# Patient Record
Sex: Male | Born: 1990 | Race: White | Hispanic: No | Marital: Single | Smoking: Current every day smoker
Health system: Southern US, Community
[De-identification: ages and names within clinical notes are randomized; demographics above are authoritative.]

## PROBLEM LIST (undated history)

## (undated) DIAGNOSIS — B999 Unspecified infectious disease: Secondary | ICD-10-CM

## (undated) DIAGNOSIS — R569 Unspecified convulsions: Secondary | ICD-10-CM

## (undated) DIAGNOSIS — J020 Streptococcal pharyngitis: Secondary | ICD-10-CM

## (undated) DIAGNOSIS — A419 Sepsis, unspecified organism: Secondary | ICD-10-CM

---

## 2019-11-26 ENCOUNTER — Emergency Department (HOSPITAL_COMMUNITY): Payer: Self-pay

## 2019-11-26 ENCOUNTER — Encounter (HOSPITAL_COMMUNITY): Payer: Self-pay

## 2019-11-26 ENCOUNTER — Other Ambulatory Visit: Payer: Self-pay

## 2019-11-26 ENCOUNTER — Inpatient Hospital Stay (HOSPITAL_COMMUNITY)
Admission: EM | Admit: 2019-11-26 | Discharge: 2019-11-28 | DRG: 918 | Disposition: A | Discharge status: Home / Self Care | Payer: Self-pay | Attending: Internal Medicine | Admitting: Internal Medicine

## 2019-11-26 DIAGNOSIS — Z79899 Other long term (current) drug therapy: Secondary | ICD-10-CM

## 2019-11-26 DIAGNOSIS — Z20822 Contact with and (suspected) exposure to covid-19: Secondary | ICD-10-CM | POA: Diagnosis present

## 2019-11-26 DIAGNOSIS — F17210 Nicotine dependence, cigarettes, uncomplicated: Secondary | ICD-10-CM | POA: Diagnosis present

## 2019-11-26 DIAGNOSIS — K718 Toxic liver disease with other disorders of liver: Secondary | ICD-10-CM | POA: Diagnosis present

## 2019-11-26 DIAGNOSIS — Z880 Allergy status to penicillin: Secondary | ICD-10-CM

## 2019-11-26 DIAGNOSIS — R112 Nausea with vomiting, unspecified: Secondary | ICD-10-CM | POA: Diagnosis present

## 2019-11-26 DIAGNOSIS — R9431 Abnormal electrocardiogram [ECG] [EKG]: Secondary | ICD-10-CM | POA: Diagnosis present

## 2019-11-26 DIAGNOSIS — K449 Diaphragmatic hernia without obstruction or gangrene: Secondary | ICD-10-CM | POA: Diagnosis present

## 2019-11-26 DIAGNOSIS — K920 Hematemesis: Secondary | ICD-10-CM | POA: Diagnosis present

## 2019-11-26 DIAGNOSIS — T387X1A Poisoning by androgens and anabolic congeners, accidental (unintentional), initial encounter: Principal | ICD-10-CM | POA: Diagnosis present

## 2019-11-26 DIAGNOSIS — E872 Acidosis: Secondary | ICD-10-CM | POA: Diagnosis present

## 2019-11-26 DIAGNOSIS — F1911 Other psychoactive substance abuse, in remission: Secondary | ICD-10-CM | POA: Diagnosis present

## 2019-11-26 DIAGNOSIS — R109 Unspecified abdominal pain: Secondary | ICD-10-CM | POA: Diagnosis present

## 2019-11-26 DIAGNOSIS — K209 Esophagitis, unspecified without bleeding: Secondary | ICD-10-CM | POA: Diagnosis present

## 2019-11-26 DIAGNOSIS — R1032 Left lower quadrant pain: Secondary | ICD-10-CM

## 2019-11-26 DIAGNOSIS — R7401 Elevation of levels of liver transaminase levels: Secondary | ICD-10-CM | POA: Diagnosis present

## 2019-11-26 HISTORY — DX: Streptococcal pharyngitis: J02.0

## 2019-11-26 HISTORY — DX: Unspecified infectious disease: B99.9

## 2019-11-26 HISTORY — DX: Unspecified convulsions: R56.9

## 2019-11-26 HISTORY — DX: Sepsis, unspecified organism: A41.9

## 2019-11-26 LAB — PROTIME-INR
INR: 1 (ref 0.8–1.2)
Prothrombin Time: 12.7 seconds (ref 11.4–15.2)

## 2019-11-26 LAB — CK: Total CK: 143 U/L (ref 49–397)

## 2019-11-26 LAB — COMPREHENSIVE METABOLIC PANEL
ALT: 1677 U/L — ABNORMAL HIGH (ref 0–44)
AST: 320 U/L — ABNORMAL HIGH (ref 15–41)
Albumin: 4.2 g/dL (ref 3.5–5.0)
Alkaline Phosphatase: 74 U/L (ref 38–126)
Anion gap: 10 (ref 5–15)
BUN: 11 mg/dL (ref 6–20)
CO2: 24 mmol/L (ref 22–32)
Calcium: 8.9 mg/dL (ref 8.9–10.3)
Chloride: 105 mmol/L (ref 98–111)
Creatinine, Ser: 0.85 mg/dL (ref 0.61–1.24)
GFR calc Af Amer: >60 mL/min (ref >60)
GFR calc non Af Amer: >60 mL/min (ref >60)
Glucose, Bld: 87 mg/dL (ref 70–99)
Potassium: 4.5 mmol/L (ref 3.5–5.1)
Sodium: 139 mmol/L (ref 135–145)
Total Bilirubin: 1.1 mg/dL (ref 0.3–1.2)
Total Protein: 7.4 g/dL (ref 6.5–8.1)

## 2019-11-26 LAB — HEPATITIS PANEL, ACUTE
HCV Ab: NONREACTIVE
Hep A IgM: NONREACTIVE
Hep B C IgM: NONREACTIVE
Hepatitis B Surface Ag: NONREACTIVE

## 2019-11-26 LAB — CBC
HCT: 48.8 % (ref 39.0–52.0)
Hemoglobin: 15.5 g/dL (ref 13.0–17.0)
MCH: 30 pg (ref 26.0–34.0)
MCHC: 31.8 g/dL (ref 30.0–36.0)
MCV: 94.4 fL (ref 80.0–100.0)
Platelets: 346 10*3/uL (ref 150–400)
RBC: 5.17 MIL/uL (ref 4.22–5.81)
RDW: 14.2 % (ref 11.5–15.5)
WBC: 10.1 10*3/uL (ref 4.0–10.5)
nRBC: 0 % (ref 0.0–0.2)

## 2019-11-26 LAB — HIV ANTIBODY (ROUTINE TESTING W REFLEX): HIV Screen 4th Generation wRfx: NONREACTIVE

## 2019-11-26 LAB — ETHANOL: Alcohol, Ethyl (B): <10 mg/dL (ref <10)

## 2019-11-26 LAB — MONONUCLEOSIS SCREEN: Mono Screen: NEGATIVE

## 2019-11-26 LAB — TROPONIN I (HIGH SENSITIVITY): Troponin I (High Sensitivity): <2 ng/L (ref <18)

## 2019-11-26 LAB — SARS CORONAVIRUS 2 BY RT PCR (HOSPITAL ORDER, PERFORMED IN ~~LOC~~ HOSPITAL LAB): SARS Coronavirus 2: NEGATIVE

## 2019-11-26 LAB — LIPASE, BLOOD: Lipase: 30 U/L (ref 11–51)

## 2019-11-26 MED ORDER — PANTOPRAZOLE SODIUM 40 MG PO TBEC
40.0000 mg | DELAYED_RELEASE_TABLET | Freq: Two times a day (BID) | ORAL | Status: DC
  Administered 2019-11-26 – 2019-11-28 (×5): 40 mg via ORAL
  Filled 2019-11-26 (×5): qty 1

## 2019-11-26 MED ORDER — IBUPROFEN 400 MG PO TABS: 400.0000 mg | ORAL_TABLET | Freq: Four times a day (QID) | ORAL | Status: DC | PRN

## 2019-11-26 MED ORDER — SODIUM CHLORIDE (PF) 0.9 % IJ SOLN
INTRAMUSCULAR | Status: AC
  Filled 2019-11-26: qty 50

## 2019-11-26 MED ORDER — ONDANSETRON HCL 4 MG PO TABS: 4.0000 mg | ORAL_TABLET | Freq: Four times a day (QID) | ORAL | Status: DC | PRN

## 2019-11-26 MED ORDER — SODIUM CHLORIDE 0.9 % IV SOLN: INTRAVENOUS | Status: DC

## 2019-11-26 MED ORDER — PROMETHAZINE HCL 25 MG/ML IJ SOLN
25.0000 mg | Freq: Once | INTRAMUSCULAR | Status: AC
  Administered 2019-11-26: 25 mg via INTRAVENOUS
  Filled 2019-11-26: qty 1

## 2019-11-26 MED ORDER — SODIUM CHLORIDE 0.9 % IV BOLUS
1000.0000 mL | Freq: Once | INTRAVENOUS | Status: AC
  Administered 2019-11-26: 1000 mL via INTRAVENOUS

## 2019-11-26 MED ORDER — PANTOPRAZOLE SODIUM 40 MG IV SOLR
40.0000 mg | Freq: Once | INTRAVENOUS | Status: AC
  Administered 2019-11-26: 40 mg via INTRAVENOUS
  Filled 2019-11-26: qty 40

## 2019-11-26 MED ORDER — KETOROLAC TROMETHAMINE 30 MG/ML IJ SOLN
30.0000 mg | Freq: Four times a day (QID) | INTRAMUSCULAR | Status: DC | PRN
  Administered 2019-11-27: 30 mg via INTRAVENOUS
  Filled 2019-11-26: qty 1

## 2019-11-26 MED ORDER — SODIUM CHLORIDE 0.9% FLUSH
3.0000 mL | Freq: Once | INTRAVENOUS | Status: AC
  Administered 2019-11-26: 3 mL via INTRAVENOUS

## 2019-11-26 MED ORDER — ONDANSETRON HCL 4 MG/2ML IJ SOLN
4.0000 mg | Freq: Once | INTRAMUSCULAR | Status: AC
  Administered 2019-11-26: 4 mg via INTRAVENOUS
  Filled 2019-11-26: qty 2

## 2019-11-26 MED ORDER — ONDANSETRON HCL 4 MG/2ML IJ SOLN: 4.0000 mg | Freq: Four times a day (QID) | INTRAMUSCULAR | Status: DC | PRN

## 2019-11-26 MED ORDER — IOHEXOL 300 MG/ML  SOLN
100.0000 mL | Freq: Once | INTRAMUSCULAR | Status: AC | PRN
  Administered 2019-11-26: 100 mL via INTRAVENOUS

## 2019-11-26 MED ORDER — KETOROLAC TROMETHAMINE 15 MG/ML IJ SOLN
15.0000 mg | Freq: Once | INTRAMUSCULAR | Status: AC
  Administered 2019-11-26: 15 mg via INTRAVENOUS
  Filled 2019-11-26: qty 1

## 2019-11-26 NOTE — ED Provider Notes (Signed)
[facility]-EMERGENCY DEPT Provider Note   CSN: [id number] Arrival date & time: 11/26/19  0750     History Chief Complaint  Patient presents with  . Sore Throat  . Chest Pain  . Hematemesis  . Leg Pain    Dean Morales is a 29 y.o. male.  Presents to ER with concern for abdominal pain, vomiting.  Patient reports since yesterday he has had significant generalized malaise, fatigue as well as abdominal pain.  Abdominal pain worse across lower abdomen but feels pain across entire abdomen currently.  Has had a couple episodes of emesis, mostly nonbloody nonbilious however had some small streaks of blood earlier this morning.  Denies any chest pain.  Also feels like he is having a sore throat.  No fevers.  Reports history of polysubstance abuse, states that he has been clean for the last 10 months.  HPI     Past Medical History:  Diagnosis Date  . Blood infection (HCC)   . Seizures (HCC)   . Strep throat     There are no problems to display for this patient.   History reviewed. No pertinent surgical history.     History reviewed. No pertinent family history.  Social History   Tobacco Use  . Smoking status: Current Every Day Smoker    Packs/day: 0.75    Types: Cigarettes  . Smokeless tobacco: Never Used  Vaping Use  . Vaping Use: Some days  . Substances: Nicotine  Substance Use Topics  . Alcohol use: Not Currently  . Drug use: Not Currently    Home Medications Prior to Admission medications   Medication Sig Start Date End Date Taking? Authorizing Provider  hydrOXYzine (VISTARIL) 50 MG capsule Take 50 mg by mouth 3 (three) times daily as needed for anxiety.   Yes [provider]    Allergies    Penicillins  Review of Systems   Review of Systems  Constitutional: Negative for chills and fever.  HENT: Negative for ear pain and sore throat.   Eyes: Negative for pain and visual disturbance.  Respiratory: Negative for cough and  shortness of breath.   Cardiovascular: Negative for chest pain and palpitations.  Gastrointestinal: Positive for abdominal pain, nausea and vomiting.  Genitourinary: Negative for dysuria and hematuria.  Musculoskeletal: Negative for arthralgias and back pain.  Skin: Negative for color change and rash.  Neurological: Negative for seizures and syncope.  All other systems reviewed and are negative.   Physical Exam Updated Vital Signs BP 115/62   Pulse 61   Temp 97.7 F (36.5 C) (Oral)   Resp 15   Ht 5\' 9"  (1.753 m)   Wt 81.6 kg   SpO2 96%   BMI 26.58 kg/m   Physical Exam Vitals and nursing note reviewed.  Constitutional:      Appearance: He is well-developed.  HENT:     Head: Normocephalic and atraumatic.  Eyes:     Conjunctiva/sclera: Conjunctivae normal.  Cardiovascular:     Rate and Rhythm: Normal rate and regular rhythm.     Heart sounds: No murmur heard.   Pulmonary:     Effort: Pulmonary effort is normal. No respiratory distress.     Breath sounds: Normal breath sounds.  Abdominal:     Palpations: Abdomen is soft.     Tenderness: There is abdominal tenderness.     Comments: Tenderness noted in right lower quadrant, left lower quadrant, no rebound or guarding  Musculoskeletal:     Cervical back:  Neck supple.  Skin:    General: Skin is warm and dry.     Capillary Refill: Capillary refill takes less than 2 seconds.  Neurological:     General: No focal deficit present.     Mental Status: He is alert and oriented to person, place, and time.     ED Results / Procedures / Treatments   Labs (all labs ordered are listed, but only abnormal results are displayed) Labs Reviewed  COMPREHENSIVE METABOLIC PANEL - Abnormal; Notable for the following components:      Result Value   AST 320 (*)    ALT 1,677 (*)    All other components within normal limits  SARS CORONAVIRUS 2 BY RT PCR (HOSPITAL ORDER, PERFORMED IN [facility])  CBC  LIPASE, BLOOD    ETHANOL  PROTIME-INR  CK  HEPATITIS PANEL, ACUTE  CERULOPLASMIN  MONONUCLEOSIS SCREEN  HEPATITIS C VRS RNA DETECT BY PCR-QUAL  ANTI-SMOOTH MUSCLE ANTIBODY, IGG  ANA W/REFLEX IF POSITIVE  IGG  ACETAMINOPHEN LEVEL  TROPONIN I (HIGH SENSITIVITY)    EKG None  Radiology DG Chest 2 View  Result Date: 11/26/2019 CLINICAL DATA:  New onset chest pain EXAM: CHEST - 2 VIEW COMPARISON:  None. FINDINGS: The heart size and mediastinal contours are within normal limits. Both lungs are clear. The visualized skeletal structures are unremarkable. Mild apical pleural thickening most likely scarring. No pneumothorax. IMPRESSION: No active cardiopulmonary disease. Electronically Signed   By: Marlan Palau M.D.   On: 11/26/2019 08:53   CT ABDOMEN PELVIS W CONTRAST  Result Date: 11/26/2019 CLINICAL DATA:  Abdominal pain EXAM: CT ABDOMEN AND PELVIS WITH CONTRAST TECHNIQUE: Multidetector CT imaging of the abdomen and pelvis was performed using the standard protocol following bolus administration of intravenous contrast. CONTRAST:  OMNIPAQUE IOHEXOL 300 MG/ML  SOLN COMPARISON:  None. FINDINGS: Lower chest: No acute abnormality. Hepatobiliary: No focal liver abnormality is seen. No gallstones, gallbladder wall thickening, or biliary dilatation. Pancreas: Unremarkable. No pancreatic ductal dilatation or surrounding inflammatory changes. Spleen: Normal in size without focal abnormality. Adrenals/Urinary Tract: Adrenal glands are within normal limits. Kidneys demonstrate a normal enhancement pattern bilaterally. No obstructive changes are seen. Bladder is within normal limits. Stomach/Bowel: Stomach is within normal limits. Appendix appears normal. No evidence of bowel wall thickening, distention, or inflammatory changes. Vascular/Lymphatic: No significant vascular findings are present. No enlarged abdominal or pelvic lymph nodes. Reproductive: Prostate is unremarkable. Other: No abdominal wall hernia or  abnormality. No abdominopelvic ascites. Musculoskeletal: No acute or significant osseous findings. IMPRESSION: No acute abnormality noted. Electronically Signed   By: Alcide Clever M.D.   On: 11/26/2019 12:59    Procedures Procedures (including critical care time)  Medications Ordered in ED Medications  sodium chloride (PF) 0.9 % injection (has no administration in time range)  pantoprazole (PROTONIX) EC tablet 40 mg (has no administration in time range)  sodium chloride flush (NS) 0.9 % injection 3 mL (3 mLs Intravenous Given 11/26/19 1021)  ondansetron (ZOFRAN) injection 4 mg (4 mg Intravenous Given 11/26/19 1102)  sodium chloride 0.9 % bolus 1,000 mL (0 mLs Intravenous Stopped 11/26/19 1201)  pantoprazole (PROTONIX) injection 40 mg (40 mg Intravenous Given 11/26/19 1054)  iohexol (OMNIPAQUE) 300 MG/ML solution 100 mL (100 mLs Intravenous Contrast Given 11/26/19 1226)  ketorolac (TORADOL) 15 MG/ML injection 15 mg (15 mg Intravenous Given 11/26/19 1341)  promethazine (PHENERGAN) injection 25 mg (25 mg Intravenous Given 11/26/19 1341)    ED Course  I have reviewed the  triage vital signs and the nursing notes.  Pertinent labs & imaging results that were available during my care of the patient were reviewed by me and considered in my medical decision making (see chart for details).  Clinical Course as of Nov 25 1456  Mon Nov 26, 2019  1127 ALT(!): 1,677 [RD]  1127 AST(!): 320 [RD]  1410 Updated pt, agreeable to admit   [RD]  1419 D/w hospitalist Arbutus Ped who will admit   [RD]    Clinical Course User Index [RD] Lucrezia Starch, MD   MDM Rules/Calculators/A&P                          29 year old male presents to ER with concern for abdominal pain, vomiting.  On exam patient is uncomfortable but in no distress.  Vital signs stable.  Noted some tenderness in lower abdomen.  Lab work concerning for transaminitis, significantly elevated ALT.  CT scan negative.  Consulted gastroenterology.   They recommended adding on a couple extra labs to further investigate as well as admitting to hospitalist service for further observation and monitoring of his LFTs.  They will come evaluate patient and place consultation note.  Discussed with Dr. Arbutus Ped with [facility] who will admit.  Patient agreeable.  Of note, patient reports prior history of opiate abuse and has declined any opiates thus far in hospitalization stay.  Pain improved after receiving Phenergan and Toradol.  Final Clinical Impression(s) / ED Diagnoses Final diagnoses:  Transaminitis  Intractable nausea and vomiting    Rx / DC Orders ED Discharge Orders    None       Lucrezia Starch, MD 11/26/19 1458

## 2019-11-26 NOTE — ED Notes (Signed)
Patient transported to CT 

## 2019-11-26 NOTE — Consult Note (Signed)
 Referring Provider:  [facility]         Primary Care Physician:  Patient, No Pcp Per Primary Gastroenterologist: unassigned             We were asked to see this patient for:   Elevated LFTs               ASSESSMENT /  PLAN    Dean Morales is a 29 y.o. male PMH significant for, but not necessarily limited to, seizures, polysubstance abuse.   # Nausea / vomiting / ? Hematemesis.  --Sounds more like he expectorated blood after several episodes of non-bloody emesis. Could be esophagitis or a Mallory Weiss tear. Overall volume of bleeding sounds low and hgb is normal ( as is BUN).  --For further evaluation will proceed with EGD in am. The risks and benefits of EGD were discussed and the patient agrees to proceed.  --BID PPI in interim  # Elevated LAE. Workup in progress.  --AST 320, ALT 1677.  --Currently no evidence for acute liver failure. Mentation is normal. INR is normal. Glucose normal.  --Rule out viral etiologies.. With sore throat I ordered mono screen. Will hold off on EBV testing for now. Acute hepatitis panel pending.   --Will obtain labs to evaluate for other etiologies of liver disease ( autoimmune, genetic, metabolic, etc) --He recently started SARMs ( Selective Androgen Receptor Modulator) a body building supplement. Quick literature check suggests it has been associated with liver injury  --Trend LFts, am labs ordered.  --Avoid hepatotoxic medications.    HPI:    Chief Complaint: nausea and vomiting  Dean Morales is a 29 y.o. male in the ED with complaints of chest pain, nausea, vomiting with blood.  In the ED his WBC is normal. Hgb is normal at 15.5, BUN normal at 11. Lipase is normal. LFTs remarkable for ALT of 1677,  AST 320, remainder of LFTs normal. Etoh level < 10. SARS negative. No abdominal pain but was tender in lower abdomen prompting CT scan with contrast which was unremarkable. Acute hepatitis panel is pending.     Dean Morales felt fine on Saturday but  then yesterday developed a sore throat and nausea followed later by chest pain, back pain, LLQ pain and lower extremity pain. No fevers. This am he began having vomiting of bilious emesis x 4 . This was followed by "spitting' up of blood.No black stool or rectal bleeding.  No upper abdominal pain. No chronic GI problems. He gives a history of being hospitalized in [place] ~ 4 months ago with a "blood infection" but he has no other information. Symptoms at the time included sore throat. Doesn't recall there being any concurrent liver problems at the time. Trelyn doesn't take NSAIDs nor Tylenol. He hasn't had any Etoh nor illicit drugs in 10 months. 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 He hasn't been taking a supplement called SARMS ( selective androgen receptor modulator). He started it a week ago, took 1/2 dose for 7 days but none in last 3 days. Father had HCV / liver failure.    PREVIOUS ENDOSCOPIC EVALUATIONS / GI STUDIES : None  Past Medical History:  Diagnosis Date  . Blood infection (HCC)   . Seizures (HCC)   . Strep throat     History reviewed. No pertinent surgical history.  Prior to Admission medications   Medication Sig Start Date End Date Taking? Authorizing   Provider  hydrOXYzine (VISTARIL) 50 MG capsule Take 50 mg by mouth 3 (three) times daily as needed for anxiety.   Yes [provider]    Current Facility-Administered Medications  Medication Dose Route Frequency Provider Last Rate Last Admin  . sodium chloride (PF) 0.9 % injection            Current Outpatient Medications  Medication Sig Dispense Refill  . hydrOXYzine (VISTARIL) 50 MG capsule Take 50 mg by mouth 3 (three) times daily as needed for anxiety.      Allergies as of 11/26/2019 - Review Complete 11/26/2019  Allergen Reaction Noted  . Penicillins Anaphylaxis and Hives 11/26/2019    History reviewed. No pertinent family history.  Social History    Socioeconomic History  . Marital status: Single    Spouse name: Not on file  . Number of children: Not on file  . Years of education: Not on file  . Highest education level: Not on file  Occupational History  . Not on file  Tobacco Use  . Smoking status: Current Every Day Smoker    Packs/day: 0.75    Types: Cigarettes  . Smokeless tobacco: Never Used  Vaping Use  . Vaping Use: Some days  . Substances: Nicotine  Substance and Sexual Activity  . Alcohol use: Not Currently  . Drug use: Not Currently  . Sexual activity: Not on file  Other Topics Concern  . Not on file  Social History Narrative  . Not on file   Social Determinants of Health   Financial Resource Strain:   . Difficulty of Paying Living Expenses:   Food Insecurity:   . Worried About Charity fundraiser in the Last Year:   . Arboriculturist in the Last Year:   Transportation Needs:   . Film/video editor (Medical):   Marland Kitchen Lack of Transportation (Non-Medical):   Physical Activity:   . Days of Exercise per Week:   . Minutes of Exercise per Session:   Stress:   . Feeling of Stress :   Social Connections:   . Frequency of Communication with Friends and Family:   . Frequency of Social Gatherings with Friends and Family:   . Attends Religious Services:   . Active Member of Clubs or Organizations:   . Attends Archivist Meetings:   Marland Kitchen Marital Status:   Intimate Partner Violence:   . Fear of Current or Ex-Partner:   . Emotionally Abused:   Marland Kitchen Physically Abused:   . Sexually Abused:     Review of Systems: All systems reviewed and negative except where noted in HPI.  Physical Exam: Vital signs in last 24 hours: Temp:  [97.7 F (36.5 C)] 97.7 F (36.5 C) (06/21 0802) Pulse Rate:  [61-91] 61 (06/21 1400) Resp:  [9-31] 15 (06/21 1400) BP: (108-139)/(59-81) 115/62 (06/21 1400) SpO2:  [94 %-99 %] 96 % (06/21 1400) Weight:  [81.6 kg] 81.6 kg (06/21 0818)   General:   Alert, well-developed, male  in NAD Psych:  Pleasant, cooperative. Normal mood and affect. Eyes:  Pupils equal, sclera clear, no icterus.   Conjunctiva pink. Ears:  Normal auditory acuity. Nose:  No deformity, discharge,  or lesions. Neck:  Supple; no masses Lungs:  Clear throughout to auscultation.   No wheezes, crackles, or rhonchi.  Heart:  Regular rate and rhythm; no murmurs, no lower extremity edema Abdomen:  Soft, non-distended, mild LLQ tenderness.. BS active, no palp mass   Rectal:  Deferred  Msk:  Symmetrical without gross deformities. . Neurologic:  Alert and  oriented x4;  grossly normal neurologically. Skin:  Intact without significant lesions or rashes. Multiple tattoos  Intake/Output from previous day: No intake/output data recorded. Intake/Output this shift: No intake/output data recorded.  Lab Results: Recent Labs    11/26/19 1025  WBC 10.1  HGB 15.5  HCT 48.8  PLT 346   BMET Recent Labs    11/26/19 1025  NA 139  K 4.5  CL 105  CO2 24  GLUCOSE 87  BUN 11  CREATININE 0.85  CALCIUM 8.9   LFT Recent Labs    11/26/19 1025  PROT 7.4  ALBUMIN 4.2  AST 320*  ALT 1,677*  ALKPHOS 74  BILITOT 1.1   PT/INR No results for input(s): LABPROT, INR in the last 72 hours. Hepatitis Panel No results for input(s): HEPBSAG, HCVAB, HEPAIGM, HEPBIGM in the last 72 hours.   . CBC Latest Ref Rng & Units 11/26/2019  WBC 4.0 - 10.5 K/uL 10.1  Hemoglobin 13.0 - 17.0 g/dL 16.3  Hematocrit 39 - 52 % 48.8  Platelets 150 - 400 K/uL 346    . CMP Latest Ref Rng & Units 11/26/2019  Glucose 70 - 99 mg/dL 87  BUN 6 - 20 mg/dL 11  Creatinine 8.46 - 6.59 mg/dL 9.35  Sodium 701 - 779 mmol/L 139  Potassium 3.5 - 5.1 mmol/L 4.5  Chloride 98 - 111 mmol/L 105  CO2 22 - 32 mmol/L 24  Calcium 8.9 - 10.3 mg/dL 8.9  Total Protein 6.5 - 8.1 g/dL 7.4  Total Bilirubin 0.3 - 1.2 mg/dL 1.1  Alkaline Phos 38 - 126 U/L 74  AST 15 - 41 U/L 320(H)  ALT 0 - 44 U/L 1,677(H)   Studies/Results: DG Chest 2  View  Result Date: 11/26/2019 CLINICAL DATA:  New onset chest pain EXAM: CHEST - 2 VIEW COMPARISON:  None. FINDINGS: The heart size and mediastinal contours are within normal limits. Both lungs are clear. The visualized skeletal structures are unremarkable. Mild apical pleural thickening most likely scarring. No pneumothorax. IMPRESSION: No active cardiopulmonary disease. Electronically Signed   By: Marlan Palau M.D.   On: 11/26/2019 08:53   CT ABDOMEN PELVIS W CONTRAST  Result Date: 11/26/2019 CLINICAL DATA:  Abdominal pain EXAM: CT ABDOMEN AND PELVIS WITH CONTRAST TECHNIQUE: Multidetector CT imaging of the abdomen and pelvis was performed using the standard protocol following bolus administration of intravenous contrast. CONTRAST:  OMNIPAQUE IOHEXOL 300 MG/ML  SOLN COMPARISON:  None. FINDINGS: Lower chest: No acute abnormality. Hepatobiliary: No focal liver abnormality is seen. No gallstones, gallbladder wall thickening, or biliary dilatation. Pancreas: Unremarkable. No pancreatic ductal dilatation or surrounding inflammatory changes. Spleen: Normal in size without focal abnormality. Adrenals/Urinary Tract: Adrenal glands are within normal limits. Kidneys demonstrate a normal enhancement pattern bilaterally. No obstructive changes are seen. Bladder is within normal limits. Stomach/Bowel: Stomach is within normal limits. Appendix appears normal. No evidence of bowel wall thickening, distention, or inflammatory changes. Vascular/Lymphatic: No significant vascular findings are present. No enlarged abdominal or pelvic lymph nodes. Reproductive: Prostate is unremarkable. Other: No abdominal wall hernia or abnormality. No abdominopelvic ascites. Musculoskeletal: No acute or significant osseous findings. IMPRESSION: No acute abnormality noted. Electronically Signed   By: Alcide Clever M.D.   On: 11/26/2019 12:59    Active Problems:   * No active hospital problems. Willette Cluster, NP-C @   11/26/2019, 2:29 PM

## 2019-11-26 NOTE — H&P (Signed)
History and Physical    Dean Morales WGN:[id number] DOB: 1990-10-10 DOA: 11/26/2019  PCP: Patient, No Pcp Per  Patient coming from: home  I have personally briefly reviewed patient's old medical records in [facility]  Chief Complaint: Abdominal pain, vomiting blood  HPI: Dean Morales is a 29 y.o. male with past medical history of substance abuse who has been clean and sober for the past 10 months and is in the [employer] program.  He presented to the ED today with abdominal pain, nausea vomiting with hematemesis.  Patient reports that he developed a sore throat and diffuse body aches and abdominal pain yesterday.  Today he he became nauseous and vomited and noted streaks of blood in his vomit prompting him to come in for evaluation.  He does state that he was wretching very hard with his vomiting.  Abdominal pain is worse in the left lower quadrant, is nonradiating.  He denies fevers or chills or diarrhea.  His only known sick contact was another guest in the sober living program who recently thought they had Covid due to what sounds like a false positive test.  Patient states that person did not seem sick, had tested positive but then on repeat tested negative.  Patient is negative for Covid today.  He denies any known exposure to uncooked or contaminated foods.  No recent travel.  He has otherwise been in good health recently without other acute illnesses.   ED Course: Vital signs normal.  CBC was unremarkable.  Renal function and electrolytes were normal.  Liver enzymes were significantly elevated with AST 320 and ALT 1677.  COVID-19 PCR is negative.  Alcohol level was<10.  CT of abdomen and pelvis was unremarkable.  Chest x-ray was negative.  ED physician discussed case with on-call gastroenterologist, obs admission for monitoring of LFTs and checking hepatitis panel was recommended.     Review of Systems: As per HPI otherwise 10 point review of systems negative.    Past  Medical History:  Diagnosis Date  . Blood infection (HCC)   . Seizures (HCC)   . Strep throat     History reviewed. No pertinent surgical history.   reports that he has been smoking cigarettes. He has been smoking about 0.75 packs per day. He has never used smokeless tobacco. He reports previous alcohol use. He reports previous drug use.     Is in [employer] program, reports clean and sober for 10 months.    Allergies  Allergen Reactions  . Penicillins Anaphylaxis and Hives    History reviewed. No pertinent family history.    Prior to Admission medications   Medication Sig Start Date End Date Taking? Authorizing Provider  hydrOXYzine (VISTARIL) 50 MG capsule Take 50 mg by mouth 3 (three) times daily as needed for anxiety.   Yes [provider]    Physical Exam: Vitals:   11/26/19 1400 11/26/19 1530 11/26/19 1704 11/26/19 1800  BP: 115/62 (!) 115/58 (!) 125/58 95/77  Pulse: 61 97 64 (!) 54  Resp: 15 14 16 17   Temp:      TempSrc:      SpO2: 96% 96% 97% 96%  Weight:      Height:         Constitutional: NAD, calm, mildly ill-appearing Eyes: EOMI, lids and conjunctivae normal ENMT: Mucous membranes are moist. Posterior pharynx clear of any exudate or lesions.neuro grossly normal. Respiratory: CTAB, no wheezing, no crackles. Normal respiratory effort. No accessory muscle  use.  Cardiovascular: RRR, no murmurs / rubs / gallops. No extremity edema. 2+ pedal pulses. No carotid bruits.  Abdomen: Left lower quadrant tenderness with voluntary guarding, no rebound tenderness, nondistended, no masses or HSM palpated. +Bowel sounds.  Musculoskeletal: no clubbing / cyanosis. No joint deformity upper and lower extremities. Normal muscle tone.  Skin: dry, intact, normal color, normal temperature Neurologic: CN 2-12 grossly intact. Normal speech.  Grossly non-focal exam. Psychiatric: Alert and oriented x 3. Normal mood. Congruent affect.  Normal judgement and  insight.   Labs on Admission: I have personally reviewed following labs and imaging studies  CBC: Recent Labs  Lab 11/26/19 1025  WBC 10.1  HGB 15.5  HCT 48.8  MCV 94.4  PLT 607   Basic Metabolic Panel: Recent Labs  Lab 11/26/19 1025  NA 139  K 4.5  CL 105  CO2 24  GLUCOSE 87  BUN 11  CREATININE 0.85  CALCIUM 8.9   GFR: Estimated Creatinine Clearance: 131.7 mL/min (by C-G formula based on SCr of 0.85 mg/dL). Liver Function Tests: Recent Labs  Lab 11/26/19 1025  AST 320*  ALT 1,677*  ALKPHOS 74  BILITOT 1.1  PROT 7.4  ALBUMIN 4.2   Recent Labs  Lab 11/26/19 1025  LIPASE 30   No results for input(s): AMMONIA in the last 168 hours. Coagulation Profile: Recent Labs  Lab 11/26/19 1345  INR 1.0   Cardiac Enzymes: Recent Labs  Lab 11/26/19 1411  CKTOTAL 143   BNP (last 3 results) No results for input(s): PROBNP in the last 8760 hours. HbA1C: No results for input(s): HGBA1C in the last 72 hours. CBG: No results for input(s): GLUCAP in the last 168 hours. Lipid Profile: No results for input(s): CHOL, HDL, LDLCALC, TRIG, CHOLHDL, LDLDIRECT in the last 72 hours. Thyroid Function Tests: No results for input(s): TSH, T4TOTAL, FREET4, T3FREE, THYROIDAB in the last 72 hours. Anemia Panel: No results for input(s): VITAMINB12, FOLATE, FERRITIN, TIBC, IRON, RETICCTPCT in the last 72 hours. Urine analysis: No results found for: COLORURINE, APPEARANCEUR, LABSPEC, PHURINE, GLUCOSEU, HGBUR, BILIRUBINUR, KETONESUR, PROTEINUR, UROBILINOGEN, NITRITE, LEUKOCYTESUR  Radiological Exams on Admission: DG Chest 2 View  Result Date: 11/26/2019 CLINICAL DATA:  New onset chest pain EXAM: CHEST - 2 VIEW COMPARISON:  None. FINDINGS: The heart size and mediastinal contours are within normal limits. Both lungs are clear. The visualized skeletal structures are unremarkable. Mild apical pleural thickening most likely scarring. No pneumothorax. IMPRESSION: No active  cardiopulmonary disease. Electronically Signed   By: Franchot Gallo M.D.   On: 11/26/2019 08:53   CT ABDOMEN PELVIS W CONTRAST  Result Date: 11/26/2019 CLINICAL DATA:  Abdominal pain EXAM: CT ABDOMEN AND PELVIS WITH CONTRAST TECHNIQUE: Multidetector CT imaging of the abdomen and pelvis was performed using the standard protocol following bolus administration of intravenous contrast. CONTRAST:  18mL OMNIPAQUE IOHEXOL 300 MG/ML  SOLN COMPARISON:  None. FINDINGS: Lower chest: No acute abnormality. Hepatobiliary: No focal liver abnormality is seen. No gallstones, gallbladder wall thickening, or biliary dilatation. Pancreas: Unremarkable. No pancreatic ductal dilatation or surrounding inflammatory changes. Spleen: Normal in size without focal abnormality. Adrenals/Urinary Tract: Adrenal glands are within normal limits. Kidneys demonstrate a normal enhancement pattern bilaterally. No obstructive changes are seen. Bladder is within normal limits. Stomach/Bowel: Stomach is within normal limits. Appendix appears normal. No evidence of bowel wall thickening, distention, or inflammatory changes. Vascular/Lymphatic: No significant vascular findings are present. No enlarged abdominal or pelvic lymph nodes. Reproductive: Prostate is unremarkable. Other: No abdominal wall hernia or  abnormality. No abdominopelvic ascites. Musculoskeletal: No acute or significant osseous findings. IMPRESSION: No acute abnormality noted. Electronically Signed   By: Alcide Clever M.D.   On: 11/26/2019 12:59    EKG: Independently reviewed. Normal sinus rhythm 80 bpm, borderline right axis deviation, RSR' in V1-2, ?ST elevation vs J point elevation in leads II, III, aVF, T wave inversions V1-2.  Assessment/Plan Principal Problem:   Transaminitis Active Problems:   Abdominal pain   Abnormal EKG   Hematemesis with nausea   Nausea & vomiting    Transaminitis - POA with AST 320, ALT 1677.  Other liver function within normal.  INR  1.0.  Abdominal pain / Nausea & vomiting / Hematemesis - POA.  Left lower abdominal pain, N/V with scant blood streaks per patient.  CT abdomen/pelvis was unremarkable.  Covid-19 PCR negative.  Hepatitis panel also negative.  EtOH <10.  Remote history of substance abuse, but patient in [employer] program and has been sober 10 months, denies recent relapse.   --GI consulted  --repeat LFT's in AM to monitor --Zofran PRN nausea --diet as tolerated --ibuprofen or Toradol PRN --pt does not want to be given narcotics, due to his history.  Please avoid, or discuss with patient beforehand, if narcotics are indicated for adequate pain control.  Abnormal EKG - initial EKG in ED with findings as above.  Patient without any chest pain.  Troponin in ED <2.  Will get a repeat EKG and repeat troponin.    DVT prophylaxis: SCDs Start: 11/26/19 1647   Code Status: full  Family Communication: none at bedside, will attempt call  Disposition Plan: expect d/c home in 1-2 days  Consults called: GI consulted in ED   Admission status:  Status is: Observation  The patient remains OBS appropriate and will d/c before 2 midnights.  Dispo: The patient is from: Home              Anticipated d/c is to: Home              Anticipated d/c date is: 1 day              Patient currently is not medically stable to d/c.   Pennie Banter, DO [facility]  11/26/2019, 6:19 PM    If 7PM-7AM, please contact night-coverage. How to contact the [facility] Attending or Consulting provider 7A - 7P or covering provider during after hours 7P -7A, for this patient?    1. Check the care team in [facility] and look for a) attending/consulting [facility] provider listed and b) the [facility] team listed 2. Log into [email or web address] and use [place]'s universal password to access. If you do not have the password, please contact the hospital operator. 3. Locate the [facility] provider you are looking for under [facility] and page to a  number that you can be directly reached. 4. If you still have difficulty reaching the provider, please page the [facility] (Director on Call) for the Hospitalists listed on amion for assistance.

## 2019-11-26 NOTE — ED Notes (Signed)
Patient given sandwich and ginger ale 

## 2019-11-26 NOTE — Plan of Care (Signed)
Plan of care discussed.   

## 2019-11-26 NOTE — H&P (View-Only) (Signed)
Referring Provider:  [facility]         Primary Care Physician:  Patient, No Pcp Per Primary Gastroenterologist: unassigned             We were asked to see this patient for:   Elevated LFTs               ASSESSMENT /  PLAN    BRICYN LABRADA is a 29 y.o. male PMH significant for, but not necessarily limited to, seizures, polysubstance abuse.   # Nausea / vomiting / ? Hematemesis.  --Sounds more like he expectorated blood after several episodes of non-bloody emesis. Could be esophagitis or a Mallory Weiss tear. Overall volume of bleeding sounds low and hgb is normal ( as is BUN).  --For further evaluation will proceed with EGD in am. The risks and benefits of EGD were discussed and the patient agrees to proceed.  --BID PPI in interim  # Elevated LAE. Workup in progress.  --AST 320, ALT 1677.  --Currently no evidence for acute liver failure. Mentation is normal. INR is normal. Glucose normal.  --Rule out viral etiologies.. With sore throat I ordered mono screen. Will hold off on EBV testing for now. Acute hepatitis panel pending.   --Will obtain labs to evaluate for other etiologies of liver disease ( autoimmune, genetic, metabolic, etc) --He recently started SARMs ( Selective Androgen Receptor Modulator) a body building supplement. Quick literature check suggests it has been associated with liver injury  --Trend LFts, am labs ordered.  --Avoid hepatotoxic medications.    HPI:    Chief Complaint: nausea and vomiting  JASPER HANF is a 29 y.o. male in the ED with complaints of chest pain, nausea, vomiting with blood.  In the ED his WBC is normal. Hgb is normal at 15.5, BUN normal at 11. Lipase is normal. LFTs remarkable for ALT of 1677,  AST 320, remainder of LFTs normal. Etoh level < 10. SARS negative. No abdominal pain but was tender in lower abdomen prompting CT scan with contrast which was unremarkable. Acute hepatitis panel is pending.     Callin felt fine on Saturday but  then yesterday developed a sore throat and nausea followed later by chest pain, back pain, LLQ pain and lower extremity pain. No fevers. This am he began having vomiting of bilious emesis x 4 . This was followed by "spitting' up of blood.No black stool or rectal bleeding.  No upper abdominal pain. No chronic GI problems. He gives a history of being hospitalized in [place] ~ 4 months ago with a "blood infection" but he has no other information. Symptoms at the time included sore throat. Doesn't recall there being any concurrent liver problems at the time. Zaidan doesn't take NSAIDs nor Tylenol. He hasn't had any Etoh nor illicit drugs in 10 months. 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 He hasn't been taking a supplement called SARMS ( selective androgen receptor modulator). He started it a week ago, took 1/2 dose for 7 days but none in last 3 days. Father had HCV / liver failure.    PREVIOUS ENDOSCOPIC EVALUATIONS / GI STUDIES : None  Past Medical History:  Diagnosis Date  . Blood infection (HCC)   . Seizures (HCC)   . Strep throat     History reviewed. No pertinent surgical history.  Prior to Admission medications   Medication Sig Start Date End Date Taking? Authorizing  Provider  hydrOXYzine (VISTARIL) 50 MG capsule Take 50 mg by mouth 3 (three) times daily as needed for anxiety.   Yes [provider]    Current Facility-Administered Medications  Medication Dose Route Frequency Provider Last Rate Last Admin  . sodium chloride (PF) 0.9 % injection            Current Outpatient Medications  Medication Sig Dispense Refill  . hydrOXYzine (VISTARIL) 50 MG capsule Take 50 mg by mouth 3 (three) times daily as needed for anxiety.      Allergies as of 11/26/2019 - Review Complete 11/26/2019  Allergen Reaction Noted  . Penicillins Anaphylaxis and Hives 11/26/2019    History reviewed. No pertinent family history.  Social History    Socioeconomic History  . Marital status: Single    Spouse name: Not on file  . Number of children: Not on file  . Years of education: Not on file  . Highest education level: Not on file  Occupational History  . Not on file  Tobacco Use  . Smoking status: Current Every Day Smoker    Packs/day: 0.75    Types: Cigarettes  . Smokeless tobacco: Never Used  Vaping Use  . Vaping Use: Some days  . Substances: Nicotine  Substance and Sexual Activity  . Alcohol use: Not Currently  . Drug use: Not Currently  . Sexual activity: Not on file  Other Topics Concern  . Not on file  Social History Narrative  . Not on file   Social Determinants of Health   Financial Resource Strain:   . Difficulty of Paying Living Expenses:   Food Insecurity:   . Worried About Charity fundraiser in the Last Year:   . Arboriculturist in the Last Year:   Transportation Needs:   . Film/video editor (Medical):   Marland Kitchen Lack of Transportation (Non-Medical):   Physical Activity:   . Days of Exercise per Week:   . Minutes of Exercise per Session:   Stress:   . Feeling of Stress :   Social Connections:   . Frequency of Communication with Friends and Family:   . Frequency of Social Gatherings with Friends and Family:   . Attends Religious Services:   . Active Member of Clubs or Organizations:   . Attends Archivist Meetings:   Marland Kitchen Marital Status:   Intimate Partner Violence:   . Fear of Current or Ex-Partner:   . Emotionally Abused:   Marland Kitchen Physically Abused:   . Sexually Abused:     Review of Systems: All systems reviewed and negative except where noted in HPI.  Physical Exam: Vital signs in last 24 hours: Temp:  [97.7 F (36.5 C)] 97.7 F (36.5 C) (06/21 0802) Pulse Rate:  [61-91] 61 (06/21 1400) Resp:  [9-31] 15 (06/21 1400) BP: (108-139)/(59-81) 115/62 (06/21 1400) SpO2:  [94 %-99 %] 96 % (06/21 1400) Weight:  [81.6 kg] 81.6 kg (06/21 0818)   General:   Alert, well-developed, male  in NAD Psych:  Pleasant, cooperative. Normal mood and affect. Eyes:  Pupils equal, sclera clear, no icterus.   Conjunctiva pink. Ears:  Normal auditory acuity. Nose:  No deformity, discharge,  or lesions. Neck:  Supple; no masses Lungs:  Clear throughout to auscultation.   No wheezes, crackles, or rhonchi.  Heart:  Regular rate and rhythm; no murmurs, no lower extremity edema Abdomen:  Soft, non-distended, mild LLQ tenderness.. BS active, no palp mass   Rectal:  Deferred  Msk:  Symmetrical without gross deformities. . Neurologic:  Alert and  oriented x4;  grossly normal neurologically. Skin:  Intact without significant lesions or rashes. Multiple tattoos  Intake/Output from previous day: No intake/output data recorded. Intake/Output this shift: No intake/output data recorded.  Lab Results: Recent Labs    11/26/19 1025  WBC 10.1  HGB 15.5  HCT 48.8  PLT 346   BMET Recent Labs    11/26/19 1025  NA 139  K 4.5  CL 105  CO2 24  GLUCOSE 87  BUN 11  CREATININE 0.85  CALCIUM 8.9   LFT Recent Labs    11/26/19 1025  PROT 7.4  ALBUMIN 4.2  AST 320*  ALT 1,677*  ALKPHOS 74  BILITOT 1.1   PT/INR No results for input(s): LABPROT, INR in the last 72 hours. Hepatitis Panel No results for input(s): HEPBSAG, HCVAB, HEPAIGM, HEPBIGM in the last 72 hours.   . CBC Latest Ref Rng & Units 11/26/2019  WBC 4.0 - 10.5 K/uL 10.1  Hemoglobin 13.0 - 17.0 g/dL 16.3  Hematocrit 39 - 52 % 48.8  Platelets 150 - 400 K/uL 346    . CMP Latest Ref Rng & Units 11/26/2019  Glucose 70 - 99 mg/dL 87  BUN 6 - 20 mg/dL 11  Creatinine 8.46 - 6.59 mg/dL 9.35  Sodium 701 - 779 mmol/L 139  Potassium 3.5 - 5.1 mmol/L 4.5  Chloride 98 - 111 mmol/L 105  CO2 22 - 32 mmol/L 24  Calcium 8.9 - 10.3 mg/dL 8.9  Total Protein 6.5 - 8.1 g/dL 7.4  Total Bilirubin 0.3 - 1.2 mg/dL 1.1  Alkaline Phos 38 - 126 U/L 74  AST 15 - 41 U/L 320(H)  ALT 0 - 44 U/L 1,677(H)   Studies/Results: DG Chest 2  View  Result Date: 11/26/2019 CLINICAL DATA:  New onset chest pain EXAM: CHEST - 2 VIEW COMPARISON:  None. FINDINGS: The heart size and mediastinal contours are within normal limits. Both lungs are clear. The visualized skeletal structures are unremarkable. Mild apical pleural thickening most likely scarring. No pneumothorax. IMPRESSION: No active cardiopulmonary disease. Electronically Signed   By: Marlan Palau M.D.   On: 11/26/2019 08:53   CT ABDOMEN PELVIS W CONTRAST  Result Date: 11/26/2019 CLINICAL DATA:  Abdominal pain EXAM: CT ABDOMEN AND PELVIS WITH CONTRAST TECHNIQUE: Multidetector CT imaging of the abdomen and pelvis was performed using the standard protocol following bolus administration of intravenous contrast. CONTRAST:  OMNIPAQUE IOHEXOL 300 MG/ML  SOLN COMPARISON:  None. FINDINGS: Lower chest: No acute abnormality. Hepatobiliary: No focal liver abnormality is seen. No gallstones, gallbladder wall thickening, or biliary dilatation. Pancreas: Unremarkable. No pancreatic ductal dilatation or surrounding inflammatory changes. Spleen: Normal in size without focal abnormality. Adrenals/Urinary Tract: Adrenal glands are within normal limits. Kidneys demonstrate a normal enhancement pattern bilaterally. No obstructive changes are seen. Bladder is within normal limits. Stomach/Bowel: Stomach is within normal limits. Appendix appears normal. No evidence of bowel wall thickening, distention, or inflammatory changes. Vascular/Lymphatic: No significant vascular findings are present. No enlarged abdominal or pelvic lymph nodes. Reproductive: Prostate is unremarkable. Other: No abdominal wall hernia or abnormality. No abdominopelvic ascites. Musculoskeletal: No acute or significant osseous findings. IMPRESSION: No acute abnormality noted. Electronically Signed   By: Alcide Clever M.D.   On: 11/26/2019 12:59    Active Problems:   * No active hospital problems. Willette Cluster, NP-C @   11/26/2019, 2:29 PM

## 2019-11-26 NOTE — ED Triage Notes (Signed)
Patient c/o intermittent intermittent left chest pain that started when he laid down last night. Patient states he is vomiting blood that started this AM while en route to the ED. Patient states, "I was vomiting bile then I had blood. Patient states he began having a sore throat yesterday. Patient states he has bilateral leg pain that started yesterday. Patient denies any injury to his legs.

## 2019-11-27 ENCOUNTER — Observation Stay (HOSPITAL_COMMUNITY): Payer: Self-pay | Admitting: Anesthesiology

## 2019-11-27 ENCOUNTER — Encounter (HOSPITAL_COMMUNITY)
Admission: EM | Disposition: A | Discharge status: Home / Self Care | Payer: Self-pay | Source: Home / Self Care | Attending: Internal Medicine

## 2019-11-27 ENCOUNTER — Encounter (HOSPITAL_COMMUNITY): Payer: Self-pay | Admitting: Internal Medicine

## 2019-11-27 DIAGNOSIS — R112 Nausea with vomiting, unspecified: Secondary | ICD-10-CM

## 2019-11-27 DIAGNOSIS — K209 Esophagitis, unspecified without bleeding: Secondary | ICD-10-CM

## 2019-11-27 DIAGNOSIS — R945 Abnormal results of liver function studies: Secondary | ICD-10-CM

## 2019-11-27 HISTORY — PX: ESOPHAGOGASTRODUODENOSCOPY (EGD) WITH PROPOFOL: SHX5813

## 2019-11-27 LAB — PROTIME-INR
INR: 1 (ref 0.8–1.2)
Prothrombin Time: 13.2 seconds (ref 11.4–15.2)

## 2019-11-27 LAB — CBC WITH DIFFERENTIAL/PLATELET
Abs Immature Granulocytes: 0.03 10*3/uL (ref 0.00–0.07)
Basophils Absolute: 0 10*3/uL (ref 0.0–0.1)
Basophils Relative: 0 %
Eosinophils Absolute: 0.1 10*3/uL (ref 0.0–0.5)
Eosinophils Relative: 1 %
HCT: 44.1 % (ref 39.0–52.0)
Hemoglobin: 13.9 g/dL (ref 13.0–17.0)
Immature Granulocytes: 0 %
Lymphocytes Relative: 29 %
Lymphs Abs: 2.7 10*3/uL (ref 0.7–4.0)
MCH: 30.5 pg (ref 26.0–34.0)
MCHC: 31.5 g/dL (ref 30.0–36.0)
MCV: 96.7 fL (ref 80.0–100.0)
Monocytes Absolute: 1 10*3/uL (ref 0.1–1.0)
Monocytes Relative: 11 %
Neutro Abs: 5.4 10*3/uL (ref 1.7–7.7)
Neutrophils Relative %: 59 %
Platelets: 307 10*3/uL (ref 150–400)
RBC: 4.56 MIL/uL (ref 4.22–5.81)
RDW: 14.2 % (ref 11.5–15.5)
WBC: 9.3 10*3/uL (ref 4.0–10.5)
nRBC: 0 % (ref 0.0–0.2)

## 2019-11-27 LAB — CERULOPLASMIN: Ceruloplasmin: 16.3 mg/dL (ref 16.0–31.0)

## 2019-11-27 LAB — COMPREHENSIVE METABOLIC PANEL
ALT: 1313 U/L — ABNORMAL HIGH (ref 0–44)
AST: 212 U/L — ABNORMAL HIGH (ref 15–41)
Albumin: 3.3 g/dL — ABNORMAL LOW (ref 3.5–5.0)
Alkaline Phosphatase: 63 U/L (ref 38–126)
Anion gap: 10 (ref 5–15)
BUN: 12 mg/dL (ref 6–20)
CO2: 21 mmol/L — ABNORMAL LOW (ref 22–32)
Calcium: 8.2 mg/dL — ABNORMAL LOW (ref 8.9–10.3)
Chloride: 107 mmol/L (ref 98–111)
Creatinine, Ser: 0.95 mg/dL (ref 0.61–1.24)
GFR calc Af Amer: >60 mL/min (ref >60)
GFR calc non Af Amer: >60 mL/min (ref >60)
Glucose, Bld: 73 mg/dL (ref 70–99)
Potassium: 4.3 mmol/L (ref 3.5–5.1)
Sodium: 138 mmol/L (ref 135–145)
Total Bilirubin: 0.9 mg/dL (ref 0.3–1.2)
Total Protein: 6 g/dL — ABNORMAL LOW (ref 6.5–8.1)

## 2019-11-27 LAB — ANA W/REFLEX IF POSITIVE: Anti Nuclear Antibody (ANA): NEGATIVE

## 2019-11-27 LAB — TROPONIN I (HIGH SENSITIVITY)
Troponin I (High Sensitivity): 3 ng/L (ref <18)
Troponin I (High Sensitivity): 3 ng/L (ref <18)

## 2019-11-27 LAB — ACETAMINOPHEN LEVEL: Acetaminophen (Tylenol), Serum: <10 ug/mL — ABNORMAL LOW (ref 10–30)

## 2019-11-27 LAB — MAGNESIUM: Magnesium: 2 mg/dL (ref 1.7–2.4)

## 2019-11-27 LAB — ANTI-SMOOTH MUSCLE ANTIBODY, IGG: F-Actin IgG: 5 Units (ref 0–19)

## 2019-11-27 LAB — HEPATITIS C VRS RNA DETECT BY PCR-QUAL: Hepatitis C Vrs RNA by PCR-Qual: NEGATIVE

## 2019-11-27 LAB — PHOSPHORUS: Phosphorus: 3.3 mg/dL (ref 2.5–4.6)

## 2019-11-27 LAB — IGG: IgG (Immunoglobin G), Serum: 783 mg/dL (ref 603–1613)

## 2019-11-27 SURGERY — ESOPHAGOGASTRODUODENOSCOPY (EGD) WITH PROPOFOL
Anesthesia: Monitor Anesthesia Care

## 2019-11-27 MED ORDER — LIDOCAINE HCL 1 % IJ SOLN
INTRAMUSCULAR | Status: DC | PRN
  Administered 2019-11-27: 40 mg via INTRADERMAL

## 2019-11-27 MED ORDER — LACTATED RINGERS IV SOLN
INTRAVENOUS | Status: AC | PRN
  Administered 2019-11-27: 10 mL/h via INTRAVENOUS

## 2019-11-27 MED ORDER — PROPOFOL 500 MG/50ML IV EMUL
INTRAVENOUS | Status: DC | PRN
  Administered 2019-11-27: 150 ug/kg/min via INTRAVENOUS

## 2019-11-27 MED ORDER — PROPOFOL 10 MG/ML IV BOLUS
INTRAVENOUS | Status: DC | PRN
  Administered 2019-11-27 (×4): 20 mg via INTRAVENOUS

## 2019-11-27 SURGICAL SUPPLY — 15 items

## 2019-11-27 NOTE — Transfer of Care (Signed)
Immediate Anesthesia Transfer of Care Note  Patient: Dean Morales  Procedure(s) Performed: ESOPHAGOGASTRODUODENOSCOPY (EGD) WITH PROPOFOL (N/A )  Patient Location: PACU and Endoscopy Unit  Anesthesia Type:MAC  Level of Consciousness: awake, alert , oriented and patient cooperative  Airway & Oxygen Therapy: Patient Spontanous Breathing and Patient connected to face mask oxygen  Post-op Assessment: Report given to RN and Post -op Vital signs reviewed and stable  Post vital signs: Reviewed and stable  Last Vitals:  Vitals Value Taken Time  BP    Temp    Pulse 74 11/27/19 1223  Resp 14 11/27/19 1223  SpO2 100 % 11/27/19 1223  Vitals shown include unvalidated device data.  Last Pain:  Vitals:   11/27/19 1113  TempSrc: Oral  PainSc: 4          Complications: No complications documented.

## 2019-11-27 NOTE — Anesthesia Postprocedure Evaluation (Signed)
Anesthesia Post Note  Patient: Dean Morales  Procedure(s) Performed: ESOPHAGOGASTRODUODENOSCOPY (EGD) WITH PROPOFOL (N/A )     Patient location during evaluation: Endoscopy Anesthesia Type: MAC Level of consciousness: awake and alert, oriented and patient cooperative Pain management: pain level controlled Vital Signs Assessment: post-procedure vital signs reviewed and stable Respiratory status: spontaneous breathing, nonlabored ventilation and respiratory function stable Cardiovascular status: blood pressure returned to baseline and stable Postop Assessment: no apparent nausea or vomiting Anesthetic complications: no   No complications documented.  Last Vitals:  Vitals:   11/27/19 1113 11/27/19 1223  BP: 137/75 (!) 101/49  Pulse: 69 74  Resp: 15 14  Temp: 36.9 C   SpO2: 98% 100%    Last Pain:  Vitals:   11/27/19 1223  TempSrc:   PainSc: 0-No pain                 Jamiracle Avants,E. Shawndell Schillaci

## 2019-11-27 NOTE — Progress Notes (Signed)
PROGRESS NOTE    Dean Morales  ASN:[id number] DOB: 04-29-1991 DOA: 11/26/2019 PCP: Patient, No Pcp Per   Brief Narrative:  HPI per Dr. Nicole Morales on 11/26/19 Dean Morales is a 30 y.o. male with past medical history of substance abuse who has been clean and sober for the past 10 months and is in the [employer] program.  He presented to the ED today with abdominal pain, nausea vomiting with hematemesis.  Patient reports that he developed a sore throat and diffuse body aches and abdominal pain yesterday.  Today he he became nauseous and vomited and noted streaks of blood in his vomit prompting him to come in for evaluation.  He does state that he was wretching very hard with his vomiting.  Abdominal pain is worse in the left lower quadrant, is nonradiating.  He denies fevers or chills or diarrhea.  His only known sick contact was another guest in the sober living program who recently thought they had Covid due to what sounds like a false positive test.  Patient states that person did not seem sick, had tested positive but then on repeat tested negative.  Patient is negative for Covid today.  He denies any known exposure to uncooked or contaminated foods.  No recent travel.  He has otherwise been in good health recently without other acute illnesses.   ED Course: Vital signs normal.  CBC was unremarkable.  Renal function and electrolytes were normal.  Liver enzymes were significantly elevated with AST 320 and ALT 1677.  COVID-19 PCR is negative.  Alcohol level was<10.  CT of abdomen and pelvis was unremarkable.  Chest x-ray was negative.  ED physician discussed case with on-call gastroenterologist, obs admission for monitoring of LFTs and checking hepatitis panel was recommended.     **Interim History GI evaluated him and are now planning for an EGD today.  LFTs are slightly improved.  We will continue IV fluid hydration but will discontinue his ibuprofen and ketorolac given suspected  hematemesis.  Not give him acetaminophen given his abnormal LFTs.  Assessment & Plan:   Principal Problem:   Transaminitis Active Problems:   Abdominal pain   Hematemesis with nausea   Nausea & vomiting   Abnormal EKG  Transaminitis/Abnormal LFTs -POA with AST 320, ALT 1677.   -Other liver function within normal.  INR 1.0. -Now patient's AST and ALT are improving with an AST of 212, and ALT of 1313 -Acute hepatitis panel was negative -Ceruloplasmin was 16.3, mono screen was negative -IgG was 73 -Alcohol level was less than 10 -Continue further work-up per GI -Continue to Monitor and Trend hepatic function panel -Repeat CMP in the a.m.  Abdominal Pain / Nausea & Vomiting / Hematemesis  - POA.  -Continues to have Left lower abdominal pain, N/V with scant blood streaks per patient.   -CT abdomen/pelvis was unremarkable.   -Covid-19 PCR negative.  Hepatitis panel also negative.  EtOH <10.  Remote history of substance abuse, but patient in [employer] program and has been sober 10 months, denies recent relapse.   -GI consulted and planning on EGD this AM  -Rrepeat LFT's in AM to monitor -C/w Supportive Care and with Zofran PRN nausea -NPO for now and diet advancement per GI -Will STOP ibuprofen and Toradol PRN given concern for Hematemesis  -Pt does not want to be given narcotics, due to his history. Try to avoid but may need discuss with patient beforehand, if narcotics are indicated for adequate pain  control given that he is having some Hematemesis and has been on NSAIDS and will not place on Acetaminophen given Abnormal LFTs. -Pending EGD Findings may need to resume NSAIDS -Care per gastroenterology -Patient received 1 L normal saline bolus and will continue with 100 mL/h for now -Continue with PPI with pantoprazole 40 g Dean.o. twice daily and continue supportive care and antiemetics with ondansetron 4 mg Dean.o./IV every 6 hours as needed for nausea   Metabolic  Acidosis -Mild.  Patient CO2 is 21, anion gap is 10, chloride level is 107 -Patient has been started on normal saline at 100 mL/h and received a 1 L bolus yesterday -Continue to monitor and trend and repeat CMP in a.m.  Abnormal EKG -Initial EKG in ED with findings of "Normal sinus rhythm 80 bpm, borderline right axis deviation, RSR' in V1-2, ?ST elevation vs J point elevation in leads II, III, aVF, T wave inversions V1-2." -Patient without any chest pain.  -Troponin in ED <2.  -Will get a repeat EKG and repeat troponin and this is pending to be done  DVT prophylaxis: SCDs given Hematemesis  Code Status: FULL CODE Family Communication: No family present at bedside  Disposition Plan: Pending further GI Clearance and Work up  Status is: Observation  The patient will require care spanning > 2 midnights and should be moved to inpatient because: Ongoing diagnostic testing needed not appropriate for outpatient work up, Unsafe d/c plan, IV treatments appropriate due to intensity of illness or inability to take PO and Inpatient level of care appropriate due to severity of illness  Dispo: The patient is from: Home              Anticipated d/c is to: Home              Anticipated d/c date is: 1 day              Patient currently is not medically stable to d/c.  Consultants:   Gastroenterology   Procedures: EGD   Antimicrobials:  Anti-infectives (From admission, onward)   None     Subjective: Seen and examined at bedside and he states that his abdomen is still hurting in the lower quadrants.  No nausea or vomiting but he states that he has been throwing up blood.  About to go for his EGD.  No chest pain or shortness of breath but states that his throat is a little sore.  No other concerns or complaints at this time.  Objective: Vitals:   11/26/19 2100 11/27/19 0150 11/27/19 0446 11/27/19 1113  BP: 105/61 (!) 103/57 111/70 137/75  Pulse: (!) 58 (!) 58 (!) 58 69  Resp: 20 18 16 15    Temp: 97.9 F (36.6 C) (!) 97.2 F (36.2 C) (!) 97.5 F (36.4 C) 98.5 F (36.9 C)  TempSrc: Oral Oral  Oral  SpO2: 98% 99% 98% 98%  Weight: 83.3 kg   81.6 kg  Height: 5\' 9"  (1.753 m)   5\' 9"  (1.753 m)    Intake/Output Summary (Last 24 hours) at 11/27/2019 1132 Last data filed at 11/27/2019 0600 Gross per 24 hour  Intake 1556.9 ml  Output 400 ml  Net 1156.9 ml   Filed Weights   11/26/19 0818 11/26/19 2100 11/27/19 1113  Weight: 81.6 kg 83.3 kg 81.6 kg   Examination: Physical Exam:  Constitutional: WN/WD overweight Caucasian male currently in NAD and appears calm and comfortable Eyes: Lids and conjunctivae normal, sclerae anicteric  ENMT: External Ears, Nose appear  normal. Grossly normal hearing. Neck: Appears normal, supple, no cervical masses, normal ROM, no appreciable thyromegaly; no JVD Respiratory: Clear to auscultation bilaterally, no wheezing, rales, rhonchi or crackles. Normal respiratory effort and patient is not tachypenic. No accessory muscle use.  Cardiovascular: RRR, no murmurs / rubs / gallops. S1 and S2 auscultated. No extremity edema. Abdomen: Soft, tender to palpate, mildly distended. No masses palpated. Bowel sounds positive.  GU: Deferred. Musculoskeletal: No clubbing / cyanosis of digits/nails. No joint deformity upper and lower extremities.  Skin: No rashes, lesions, ulcers on physical evaluation but he does have a left arm sleeve tattoo. No induration; Warm and dry.  Neurologic: CN 2-12 grossly intact with no focal deficits. Romberg sign and cerebellar reflexes not assessed.  Psychiatric: Normal judgment and insight. Alert and oriented x 3. Normal mood and appropriate affect.   Data Reviewed: I have personally reviewed following labs and imaging studies  CBC: Recent Labs  Lab 11/26/19 1025 11/27/19 0252  WBC 10.1 9.3  NEUTROABS  --  5.4  HGB 15.5 13.9  HCT 48.8 44.1  MCV 94.4 96.7  PLT 346 307   Basic Metabolic Panel: Recent Labs  Lab  11/26/19 1025 11/27/19 0252  NA 139 138  K 4.5 4.3  CL 105 107  CO2 24 21*  GLUCOSE 87 73  BUN 11 12  CREATININE 0.85 0.95  CALCIUM 8.9 8.2*  MG  --  2.0  PHOS  --  3.3   GFR: Estimated Creatinine Clearance: 117.8 mL/min (by C-G formula based on SCr of 0.95 mg/dL). Liver Function Tests: Recent Labs  Lab 11/26/19 1025 11/27/19 0252  AST 320* 212*  ALT 1,677* 1,313*  ALKPHOS 74 63  BILITOT 1.1 0.9  PROT 7.4 6.0*  ALBUMIN 4.2 3.3*   Recent Labs  Lab 11/26/19 1025  LIPASE 30   No results for input(s): AMMONIA in the last 168 hours. Coagulation Profile: Recent Labs  Lab 11/26/19 1345 11/27/19 0252  INR 1.0 1.0   Cardiac Enzymes: Recent Labs  Lab 11/26/19 1411  CKTOTAL 143   BNP (last 3 results) No results for input(s): PROBNP in the last 8760 hours. HbA1C: No results for input(s): HGBA1C in the last 72 hours. CBG: No results for input(s): GLUCAP in the last 168 hours. Lipid Profile: No results for input(s): CHOL, HDL, LDLCALC, TRIG, CHOLHDL, LDLDIRECT in the last 72 hours. Thyroid Function Tests: No results for input(s): TSH, T4TOTAL, FREET4, T3FREE, THYROIDAB in the last 72 hours. Anemia Panel: No results for input(s): VITAMINB12, FOLATE, FERRITIN, TIBC, IRON, RETICCTPCT in the last 72 hours. Sepsis Labs: No results for input(s): PROCALCITON, LATICACIDVEN in the last 168 hours.  Recent Results (from the past 240 hour(s))  SARS Coronavirus 2 by RT PCR (hospital order, performed in [facility] hospital lab) Nasopharyngeal Nasopharyngeal Swab     Status: None   Collection Time: 11/26/19 10:46 AM   Specimen: Nasopharyngeal Swab  Result Value Ref Range Status   SARS Coronavirus 2 NEGATIVE NEGATIVE Final    Comment: (NOTE) SARS-CoV-2 target nucleic acids are NOT DETECTED.  The SARS-CoV-2 RNA is generally detectable in upper and lower respiratory specimens during the acute phase of infection. The lowest concentration of SARS-CoV-2 viral copies this assay  can detect is 250 copies / mL. A negative result does not preclude SARS-CoV-2 infection and should not be used as the sole basis for treatment or other patient management decisions.  A negative result may occur with improper specimen collection / handling, submission of specimen other  than nasopharyngeal swab, presence of viral mutation(s) within the areas targeted by this assay, and inadequate number of viral copies (<250 copies / mL). A negative result must be combined with clinical observations, patient history, and epidemiological information.  Fact Sheet for Patients:   BoilerBrush.com.cy  Fact Sheet for Healthcare Providers: https://pope.com/  This test is not yet approved or  cleared by the [place] FDA and has been authorized for detection and/or diagnosis of SARS-CoV-2 by FDA under an Emergency Use Authorization (EUA).  This EUA will remain in effect (meaning this test can be used) for the duration of the COVID-19 declaration under Section 564(b)(1) of the Act, 21 U.S.C. section 360bbb-3(b)(1), unless the authorization is terminated or revoked sooner.  Performed at [facility], [address] [place], [place] [address]      RN Pressure Injury Documentation:     Estimated body mass index is 26.58 kg/m as calculated from the following:   Height as of this encounter: 5\' 9"  (1.753 m).   Weight as of this encounter: 81.6 kg.  Malnutrition Type:      Malnutrition Characteristics:      Nutrition Interventions:     Radiology Studies: DG Chest 2 View  Result Date: 11/26/2019 CLINICAL DATA:  New onset chest pain EXAM: CHEST - 2 VIEW COMPARISON:  None. FINDINGS: The heart size and mediastinal contours are within normal limits. Both lungs are clear. The visualized skeletal structures are unremarkable. Mild apical pleural thickening most likely scarring. No pneumothorax. IMPRESSION: No active  cardiopulmonary disease. Electronically Signed   By: 11/28/2019 M.D.   On: 11/26/2019 08:53   CT ABDOMEN PELVIS W CONTRAST  Result Date: 11/26/2019 CLINICAL DATA:  Abdominal pain EXAM: CT ABDOMEN AND PELVIS WITH CONTRAST TECHNIQUE: Multidetector CT imaging of the abdomen and pelvis was performed using the standard protocol following bolus administration of intravenous contrast. CONTRAST:  11/28/2019 OMNIPAQUE IOHEXOL 300 MG/ML  SOLN COMPARISON:  None. FINDINGS: Lower chest: No acute abnormality. Hepatobiliary: No focal liver abnormality is seen. No gallstones, gallbladder wall thickening, or biliary dilatation. Pancreas: Unremarkable. No pancreatic ductal dilatation or surrounding inflammatory changes. Spleen: Normal in size without focal abnormality. Adrenals/Urinary Tract: Adrenal glands are within normal limits. Kidneys demonstrate a normal enhancement pattern bilaterally. No obstructive changes are seen. Bladder is within normal limits. Stomach/Bowel: Stomach is within normal limits. Appendix appears normal. No evidence of bowel wall thickening, distention, or inflammatory changes. Vascular/Lymphatic: No significant vascular findings are present. No enlarged abdominal or pelvic lymph nodes. Reproductive: Prostate is unremarkable. Other: No abdominal wall hernia or abnormality. No abdominopelvic ascites. Musculoskeletal: No acute or significant osseous findings. IMPRESSION: No acute abnormality noted. Electronically Signed   By: M.D.   On: 11/26/2019 12:59   Scheduled Meds: . [MAR Hold] pantoprazole  40 mg Oral BID   Continuous Infusions: . sodium chloride 100 mL/hr at 11/27/19 1105  . lactated ringers 10 mL/hr at 11/27/19 1123    LOS: 0 days   11/29/19, DO [facility] PAGER is on AMION  If 7PM-7AM, please contact night-coverage [email or web address]

## 2019-11-27 NOTE — Op Note (Addendum)
[facility] Patient Name: Dean Morales Procedure Date: 11/27/2019 MRN: [id number] Attending MD: Carlota Raspberry. Havery Moros , MD Date of Birth: 04/03/1991 CSN: [id number] Age: 29 Admit Type: Inpatient Procedure:                Upper GI endoscopy Indications:              Hematemesis in the setting of nausea / vomiting,                            hepatitis Providers:                Carlota Raspberry. Havery Moros, MD, Cleda Daub, RN, Cherylynn Ridges, Technician, Karis Juba, CRNA Referring MD:              Medicines:                Monitored Anesthesia Care Complications:            No immediate complications. Estimated blood loss:                            Minimal. Estimated Blood Loss:     Estimated blood loss was minimal. Procedure:                Pre-Anesthesia Assessment:                           - Prior to the procedure, a History and Physical                            was performed, and patient medications and                            allergies were reviewed. The patient's tolerance of                            previous anesthesia was also reviewed. The risks                            and benefits of the procedure and the sedation                            options and risks were discussed with the patient.                            All questions were answered, and informed consent                            was obtained. Prior Anticoagulants: The patient has                            taken no previous anticoagulant or antiplatelet  agents. ASA Grade Assessment: II - A patient with                            mild systemic disease. After reviewing the risks                            and benefits, the patient was deemed in                            satisfactory condition to undergo the procedure.                           After obtaining informed consent, the endoscope was                            passed under  direct vision. Throughout the                            procedure, the patient's blood pressure, pulse, and                            oxygen saturations were monitored continuously. The                            [id number]-H190 ([id number]) Olympus gastroscope was                            introduced through the mouth, and advanced to the                            second part of duodenum. The upper GI endoscopy was                            accomplished without difficulty. The patient                            tolerated the procedure well. Scope In: Scope Out: Findings:      Esophagogastric landmarks were identified: the Z-line was found at 38       cm, the gastroesophageal junction was found at 38 cm and the upper       extent of the gastric folds was found at 41 cm from the incisors.      A 3 cm hiatal hernia was present.      Mild focal esophagitis was found 38 cm from the incisors.      The exam of the esophagus was otherwise normal.      The entire examined stomach was normal.      The duodenal bulb and second portion of the duodenum were normal. Impression:               - Esophagogastric landmarks identified.                           - 3 cm hiatal hernia.                           -  Mild focal esophagitis.                           - Normal esophagus otherwise                           - Normal stomach.                           - Normal duodenal bulb and second portion of the                            duodenum.                           No high risk stigmata for bleeding, which could                            have come from mild esophagitis in the setting of                            vomiting or Mallory weiss tear which has since                            healed. Moderate Sedation:      No moderate sedation, case performed with MAC Recommendation:           - Return patient to hospital ward for ongoing care.                           - Advance diet as tolerated.                            - Continue present medications.                           - Recommendations per inpatient GI service note Procedure Code(s):        --- Professional ---                           [phone], Esophagogastroduodenoscopy, flexible,                            transoral; diagnostic, including collection of                            specimen(s) by brushing or washing, when performed                            (separate procedure) Diagnosis Code(s):        --- Professional ---                           K44.9, Diaphragmatic hernia without obstruction or                            gangrene  K20.90, Esophagitis, unspecified without bleeding                           K92.0, Hematemesis CPT copyright 2019 American Medical Association. All rights reserved. The codes documented in this report are preliminary and upon coder review may  be revised to meet current compliance requirements. [name], MD 11/27/2019 12:20:59 PM This report has been signed electronically. Number of Addenda: 0

## 2019-11-27 NOTE — Interval H&P Note (Signed)
History and Physical Interval Note:  11/27/2019 11:39 AM  Dean Morales  has presented today for surgery, with the diagnosis of nausea, hematemesis.  The various methods of treatment have been discussed with the patient and family. After consideration of risks, benefits and other options for treatment, the patient has consented to  Procedure(s): ESOPHAGOGASTRODUODENOSCOPY (EGD) WITH PROPOFOL (N/A) as a surgical intervention.  The patient's history has been reviewed, patient examined, no change in status, stable for surgery.  I have reviewed the patient's chart and labs.  Questions were answered to the patient's satisfaction.     Viviann Spare P Deyna Carbon

## 2019-11-27 NOTE — Anesthesia Preprocedure Evaluation (Addendum)
Anesthesia Evaluation  Patient identified by MRN, date of birth, ID band Patient awake    Reviewed: Allergy & Precautions, NPO status , Patient's Chart, lab work & pertinent test results  History of Anesthesia Complications Negative for: history of anesthetic complications  Airway Mallampati: I  TM Distance: >3 FB Neck ROM: Full    Dental  (+) Poor Dentition, Chipped, Missing, Dental Advisory Given   Pulmonary Current Smoker and Patient abstained from smoking.,  11/26/2019 SARS coronavirus NEG   breath sounds clear to auscultation       Cardiovascular negative cardio ROS   Rhythm:Regular Rate:Normal     Neuro/Psych Seizures -,     GI/Hepatic (+)     substance abuse (SARMs ( Selective Androgen Receptor Modulator) a body building supplement)  , Elevated LFTs Admitted for hematemesis, no N/V today   Endo/Other  negative endocrine ROS  Renal/GU negative Renal ROS     Musculoskeletal   Abdominal   Peds  Hematology   Anesthesia Other Findings   Reproductive/Obstetrics                            Anesthesia Physical Anesthesia Plan  ASA: III  Anesthesia Plan: MAC   Post-op Pain Management:    Induction:   PONV Risk Score and Plan: 0 and Treatment may vary due to age or medical condition  Airway Management Planned: Natural Airway and Nasal Cannula  Additional Equipment: None  Intra-op Plan:   Post-operative Plan:   Informed Consent: I have reviewed the patients History and Physical, chart, labs and discussed the procedure including the risks, benefits and alternatives for the proposed anesthesia with the patient or authorized representative who has indicated his/her understanding and acceptance.     Dental advisory given  Plan Discussed with: CRNA and Surgeon  Anesthesia Plan Comments:        Anesthesia Quick Evaluation

## 2019-11-27 NOTE — Progress Notes (Signed)
GI UPDATE:  EGD done this AM. Very mild focal esophagitis, 3cm hiatal hernia, otherwise normal exam. Unclear if his scant bleeding symptoms were due to mild esophagitis in the setting of nausea / vomiting, or self limited Mallory weiss tear which has since healed. No high risk stigmata for bleeding.  Otherwise, transaminitis improved but ALT remains quite high > 1000. Initial labs negative as to clear etiology, some remain pending. INR normal, no evidence of synthetic dysfunction and that is reassuring. Would keep inpatient today and repeat LAES tomorrow. Drug induced liver injury from workout supplement remains possible. Hopefully if LAEs continue to downtrend nothing else needs to be done. If they do not or rise, then would consider liver biopsy, pending lab workup results. Of note, patient denies tylenol intake and I had thought was ordered but not done, will check to ensure okay. Call with questions otherwise.  Ileene Patrick, MD [facility]

## 2019-11-28 ENCOUNTER — Encounter (HOSPITAL_COMMUNITY): Payer: Self-pay | Admitting: Gastroenterology

## 2019-11-28 ENCOUNTER — Other Ambulatory Visit: Payer: Self-pay | Admitting: Gastroenterology

## 2019-11-28 DIAGNOSIS — R7401 Elevation of levels of liver transaminase levels: Secondary | ICD-10-CM

## 2019-11-28 LAB — COMPREHENSIVE METABOLIC PANEL
ALT: 1167 U/L — ABNORMAL HIGH (ref 0–44)
AST: 169 U/L — ABNORMAL HIGH (ref 15–41)
Albumin: 3.2 g/dL — ABNORMAL LOW (ref 3.5–5.0)
Alkaline Phosphatase: 67 U/L (ref 38–126)
Anion gap: 7 (ref 5–15)
BUN: 12 mg/dL (ref 6–20)
CO2: 23 mmol/L (ref 22–32)
Calcium: 7.9 mg/dL — ABNORMAL LOW (ref 8.9–10.3)
Chloride: 107 mmol/L (ref 98–111)
Creatinine, Ser: 1.02 mg/dL (ref 0.61–1.24)
GFR calc Af Amer: >60 mL/min (ref >60)
GFR calc non Af Amer: >60 mL/min (ref >60)
Glucose, Bld: 85 mg/dL (ref 70–99)
Potassium: 4.1 mmol/L (ref 3.5–5.1)
Sodium: 137 mmol/L (ref 135–145)
Total Bilirubin: 0.6 mg/dL (ref 0.3–1.2)
Total Protein: 5.8 g/dL — ABNORMAL LOW (ref 6.5–8.1)

## 2019-11-28 LAB — CBC WITH DIFFERENTIAL/PLATELET
Abs Immature Granulocytes: 0.03 10*3/uL (ref 0.00–0.07)
Basophils Absolute: 0 10*3/uL (ref 0.0–0.1)
Basophils Relative: 0 %
Eosinophils Absolute: 0.1 10*3/uL (ref 0.0–0.5)
Eosinophils Relative: 1 %
HCT: 41.4 % (ref 39.0–52.0)
Hemoglobin: 13.1 g/dL (ref 13.0–17.0)
Immature Granulocytes: 0 %
Lymphocytes Relative: 32 %
Lymphs Abs: 2.8 10*3/uL (ref 0.7–4.0)
MCH: 29.8 pg (ref 26.0–34.0)
MCHC: 31.6 g/dL (ref 30.0–36.0)
MCV: 94.3 fL (ref 80.0–100.0)
Monocytes Absolute: 1.1 10*3/uL — ABNORMAL HIGH (ref 0.1–1.0)
Monocytes Relative: 12 %
Neutro Abs: 4.9 10*3/uL (ref 1.7–7.7)
Neutrophils Relative %: 55 %
Platelets: 261 10*3/uL (ref 150–400)
RBC: 4.39 MIL/uL (ref 4.22–5.81)
RDW: 14 % (ref 11.5–15.5)
WBC: 9 10*3/uL (ref 4.0–10.5)
nRBC: 0 % (ref 0.0–0.2)

## 2019-11-28 LAB — MAGNESIUM: Magnesium: 2 mg/dL (ref 1.7–2.4)

## 2019-11-28 LAB — TROPONIN I (HIGH SENSITIVITY): Troponin I (High Sensitivity): 3 ng/L (ref <18)

## 2019-11-28 LAB — PHOSPHORUS: Phosphorus: 3.2 mg/dL (ref 2.5–4.6)

## 2019-11-28 MED ORDER — PANTOPRAZOLE SODIUM 20 MG PO TBEC: 20.0000 mg | DELAYED_RELEASE_TABLET | Freq: Every day | ORAL | 1 refills | Status: AC

## 2019-11-28 NOTE — Progress Notes (Signed)
      Progress Note   Subjective  Patient feeling much better. Tolerating diet. No further vomiting. Generally feels improved, wants to go home.   Objective   Vital signs in last 24 hours: Temp:  [97.8 F (36.6 C)-98.2 F (36.8 C)] 97.9 F (36.6 C) (06/23 0512) Pulse Rate:  [60-75] 66 (06/23 1403) Resp:  [15-16] 16 (06/23 1403) BP: (105-135)/(47-80) 135/80 (06/23 1403) SpO2:  [96 %-99 %] 97 % (06/23 1403) Last BM Date: 11/25/19 General:    white male in NAD Abdomen:  Soft, nontender and nondistended.  Extremities:  Without edema. Neurologic:  Alert and oriented,  grossly normal neurologically. Psych:  Cooperative. Normal mood and affect.  Intake/Output from previous day: 06/22 0701 - 06/23 0700 In: 3146.7 [P.O.:960; I.V.:2186.7] Out: 2700 [Urine:2700] Intake/Output this shift: Total I/O In: 640 [P.O.:240; I.V.:400] Out: 650 [Urine:650]  Lab Results: Recent Labs    11/26/19 1025 11/27/19 0252 11/28/19 0239  WBC 10.1 9.3 9.0  HGB 15.5 13.9 13.1  HCT 48.8 44.1 41.4  PLT 346 307 261   BMET Recent Labs    11/26/19 1025 11/27/19 0252 11/28/19 0239  NA 139 138 137  K 4.5 4.3 4.1  CL 105 107 107  CO2 24 21* 23  GLUCOSE 87 73 85  BUN 11 12 12   CREATININE 0.85 0.95 1.02  CALCIUM 8.9 8.2* 7.9*   LFT Recent Labs    11/28/19 0239  PROT 5.8*  ALBUMIN 3.2*  AST 169*  ALT 1,167*  ALKPHOS 67  BILITOT 0.6   PT/INR Recent Labs    11/26/19 1345 11/27/19 0252  LABPROT 12.7 13.2  INR 1.0 1.0    Studies/Results: No results found.     Assessment / Plan:   29 y/o male with h/o polysubstance abuse (clean for the past 10 months per his report), who presented with nausea / vomiting, hematemesis, also found to have significant transaminitis. ALT 1600s, AST 300s on admission. Synthetic function normal, INR 1.0 and no encephalopathy.  His serologic workup has been largely unrevealing. Ceruloplasmin at low end of normal but Wilson's still less likely. Most  likely is drug induced liver injury from his workout supplement. His ALT/AST are coming down, counseled patient that this may take weeks to months to normalize if from DILI. I think he can go home today given he is feeling better and enzymes have a clear downtrend with normal INR. We will plan on repeating LFTs and INR on Friday (48 hours) to ensure things continue to look good. As long as downtrend continues, we will trend this until normalization and nothing else to do. If enzyme elevation persists or worsens, then will need liver biopsy. He understands this, will avoid workout supplements, no alcohol, or any other new medications (take takes hydroxyzine PRN which is safe to continue to use)/   EGD showed mild esophagitis, would discharge on protonix 20mg  / day for the next few weeks. I suspect his nausea / vomiting was from his significant liver injury and is much better now.   Call with questions, will await his follow up labs on Friday.  , MD [facility]

## 2019-11-28 NOTE — Discharge Summary (Signed)
Physician Discharge Summary  Dean Morales MBW:[id number] DOB: 22-Nov-1990   PCP: Patient, No Pcp Per  Admit date: 11/26/2019 Discharge date: 11/28/2019 Length of Stay: 1 days   Code Status: Full Code  Admitted From:  Home Discharged to:   Home Home Health:  None  Equipment/Devices:  None Discharge Condition:  Stable  Recommendations for Outpatient Follow-up   1. Follow up with GI this Friday for CMP in the office to trend LFTs and ensure trending downward.  If his LFTs begin to increase he may need a liver biopsy  Hospital Summary  This is a 29 year old male with history of substance abuse who has been sober for the past 10 months and is in the [employer] program who presented to the ED on 6/21 with abdominal pain, nausea and vomiting and hematemesis x1 day as well as sore throat and diffuse body aches.  ED Course:Vital signs normal. CBC was unremarkable. Renal function and electrolytes were normal. Liver enzymes were significantly elevated with AST 320 and ALT 1677. COVID-19 PCR is negative. Alcohol level was<10.CT of abdomen and pelvis was unremarkable. Chest x-ray was negative. ED physician discussed casewith on-call gastroenterologist, obs admissionformonitoring of LFTsand checking hepatitis panelwas recommended.  Patient underwent EGD with Dr. Adela Lank on 6/22 which showed very mild focal esophagitis, 3 cm hiatal hernia but otherwise unremarkable.  His INR remains normal and his LFTs remained elevated (ALT 1000+) but trended downward and patient had significant improvement and resolution of symptoms.  His symptoms were thought to be secondary to DILI from taking a workout supplement SARMs (selective androgen receptor modulators) and patient was cleared for discharge by GI on 6/23 with close follow-up on 6/25 in the office with CMP to be done at the office and advised to stop taking these workout supplements.  He was discharged with Protonix 20 mg daily for his  esophagitis.  A & P   Principal Problem:   Transaminitis Active Problems:   Abdominal pain   Hematemesis with nausea   Nausea & vomiting   Abnormal EKG   1. Nausea, vomiting, abdominal pain suspected secondary to drug-induced liver injury from workout supplements 1. S/p EGD on 6/22 with mild focal esophagitis and a hiatal hernia but otherwise unremarkable 2. AST 320->> 169, ALT 1677->> 1167, INR 1.0, negative Tylenol level, negative hepatitis panel, basic rheumatologic work-up negative 3. Taking SARM's (selective androgen receptor modulators) is a workout supplement x1 week, this likely led to DILI 4. Follow-up with GI on 6/25 with CMP in the office 2. Mild focal esophagitis seen on EGD 1. Protonix 20 mg daily 3. Hematemesis, likely irritation from nausea and vomiting and esophagitis, resolved 1. Plan as above 4. Abnormal EKG 1. Repeat EKG and troponin unremarkable 2. Looks like early repolarization on personal read, no further work-up for now    Consultants  . GI  Procedures  . EGD 6/22  Antibiotics   Anti-infectives (From admission, onward)   None       Subjective  Patient seen and examined at bedside no acute distress and resting comfortably.  No events overnight.  Tolerating diet. In good spirits and anticipating discharge.   Denies any chest pain, shortness of breath, fever, nausea, vomiting, urinary or bowel complaints. Otherwise ROS negative    Objective   Discharge Exam: Vitals:   11/28/19 0512 11/28/19 1403  BP: 131/73 135/80  Pulse: 64 66  Resp: 15 16  Temp: 97.9 F (36.6 C)   SpO2: 99% 97%   Vitals:  11/27/19 2136 11/28/19 0136 11/28/19 0512 11/28/19 1403  BP: 125/71 127/62 131/73 135/80  Pulse: 62 60 64 66  Resp: 16 16 15 16   Temp: 98.2 F (36.8 C) 98 F (36.7 C) 97.9 F (36.6 C)   TempSrc: Oral Oral Oral   SpO2: 96% 99% 99% 97%  Weight:      Height:        Physical Exam Vitals and nursing note reviewed.  Constitutional:       Appearance: Normal appearance.  HENT:     Head: Normocephalic and atraumatic.  Eyes:     Conjunctiva/sclera: Conjunctivae normal.  Cardiovascular:     Rate and Rhythm: Normal rate and regular rhythm.  Pulmonary:     Effort: Pulmonary effort is normal.     Breath sounds: Normal breath sounds.  Abdominal:     General: Abdomen is flat.     Palpations: Abdomen is soft.  Musculoskeletal:        General: No swelling or tenderness.  Skin:    Coloration: Skin is not jaundiced or pale.  Neurological:     Mental Status: He is alert. Mental status is at baseline.  Psychiatric:        Mood and Affect: Mood normal.        Behavior: Behavior normal.       The results of significant diagnostics from this hospitalization (including imaging, microbiology, ancillary and laboratory) are listed below for reference.     Microbiology: Recent Results (from the past 240 hour(s))  SARS Coronavirus 2 by RT PCR (hospital order, performed in [facility] hospital lab) Nasopharyngeal Nasopharyngeal Swab     Status: None   Collection Time: 11/26/19 10:46 AM   Specimen: Nasopharyngeal Swab  Result Value Ref Range Status   SARS Coronavirus 2 NEGATIVE NEGATIVE Final    Comment: (NOTE) SARS-CoV-2 target nucleic acids are NOT DETECTED.  The SARS-CoV-2 RNA is generally detectable in upper and lower respiratory specimens during the acute phase of infection. The lowest concentration of SARS-CoV-2 viral copies this assay can detect is 250 copies / mL. A negative result does not preclude SARS-CoV-2 infection and should not be used as the sole basis for treatment or other patient management decisions.  A negative result may occur with improper specimen collection / handling, submission of specimen other than nasopharyngeal swab, presence of viral mutation(s) within the areas targeted by this assay, and inadequate number of viral copies (<250 copies / mL). A negative result must be combined with  clinical observations, patient history, and epidemiological information.  Fact Sheet for Patients:   11/28/19  Fact Sheet for Healthcare Providers: BoilerBrush.com.cy  This test is not yet approved or  cleared by the https://pope.com/ FDA and has been authorized for detection and/or diagnosis of SARS-CoV-2 by FDA under an Emergency Use Authorization (EUA).  This EUA will remain in effect (meaning this test can be used) for the duration of the COVID-19 declaration under Section 564(b)(1) of the Act, 21 U.S.C. section 360bbb-3(b)(1), unless the authorization is terminated or revoked sooner.  Performed at [facility], [address], [place], [place]      Labs: BNP (last 3 results) No results for input(s): BNP in the last 8760 hours. Basic Metabolic Panel: Recent Labs  Lab 11/26/19 1025 11/27/19 0252 11/28/19 0239  NA 139 138 137  K 4.5 4.3 4.1  CL 105 107 107  CO2 24 21* 23  GLUCOSE 87 73 85  BUN 11 12 12  CREATININE 0.85 0.95 1.02  CALCIUM 8.9 8.2* 7.9*  MG  --  2.0 2.0  PHOS  --  3.3 3.2   Liver Function Tests: Recent Labs  Lab 11/26/19 1025 11/27/19 0252 11/28/19 0239  AST 320* 212* 169*  ALT 1,677* 1,313* 1,167*  ALKPHOS 74 63 67  BILITOT 1.1 0.9 0.6  PROT 7.4 6.0* 5.8*  ALBUMIN 4.2 3.3* 3.2*   Recent Labs  Lab 11/26/19 1025  LIPASE 30   No results for input(s): AMMONIA in the last 168 hours. CBC: Recent Labs  Lab 11/26/19 1025 11/27/19 0252 11/28/19 0239  WBC 10.1 9.3 9.0  NEUTROABS  --  5.4 4.9  HGB 15.5 13.9 13.1  HCT 48.8 44.1 41.4  MCV 94.4 96.7 94.3  PLT 346 307 261   Cardiac Enzymes: Recent Labs  Lab 11/26/19 1411  CKTOTAL 143   BNP: Invalid input(s): POCBNP CBG: No results for input(s): GLUCAP in the last 168 hours. D-Dimer No results for input(s): DDIMER in the last 72 hours. Hgb A1c No results for input(s): HGBA1C in the last 72  hours. Lipid Profile No results for input(s): CHOL, HDL, LDLCALC, TRIG, CHOLHDL, LDLDIRECT in the last 72 hours. Thyroid function studies No results for input(s): TSH, T4TOTAL, T3FREE, THYROIDAB in the last 72 hours.  Invalid input(s): FREET3 Anemia work up No results for input(s): VITAMINB12, FOLATE, FERRITIN, TIBC, IRON, RETICCTPCT in the last 72 hours. Urinalysis No results found for: COLORURINE, APPEARANCEUR, LABSPEC, PHURINE, GLUCOSEU, HGBUR, BILIRUBINUR, KETONESUR, PROTEINUR, UROBILINOGEN, NITRITE, LEUKOCYTESUR Sepsis Labs Invalid input(s): PROCALCITONIN,  WBC,  LACTICIDVEN Microbiology Recent Results (from the past 240 hour(s))  SARS Coronavirus 2 by RT PCR (hospital order, performed in [facility] hospital lab) Nasopharyngeal Nasopharyngeal Swab     Status: None   Collection Time: 11/26/19 10:46 AM   Specimen: Nasopharyngeal Swab  Result Value Ref Range Status   SARS Coronavirus 2 NEGATIVE NEGATIVE Final    Comment: (NOTE) SARS-CoV-2 target nucleic acids are NOT DETECTED.  The SARS-CoV-2 RNA is generally detectable in upper and lower respiratory specimens during the acute phase of infection. The lowest concentration of SARS-CoV-2 viral copies this assay can detect is 250 copies / mL. A negative result does not preclude SARS-CoV-2 infection and should not be used as the sole basis for treatment or other patient management decisions.  A negative result may occur with improper specimen collection / handling, submission of specimen other than nasopharyngeal swab, presence of viral mutation(s) within the areas targeted by this assay, and inadequate number of viral copies (<250 copies / mL). A negative result must be combined with clinical observations, patient history, and epidemiological information.  Fact Sheet for Patients:   BoilerBrush.com.cy  Fact Sheet for Healthcare Providers: https://pope.com/  This test is not yet  approved or  cleared by the [place] FDA and has been authorized for detection and/or diagnosis of SARS-CoV-2 by FDA under an Emergency Use Authorization (EUA).  This EUA will remain in effect (meaning this test can be used) for the duration of the COVID-19 declaration under Section 564(b)(1) of the Act, 21 U.S.C. section 360bbb-3(b)(1), unless the authorization is terminated or revoked sooner.  Performed at [facility], [address], [place], [place] [address]     Discharge Instructions     Discharge Instructions    Diet - low sodium heart healthy   Complete by: As directed    Discharge instructions   Complete by: As directed    -Take Protonix 20 mg daily -Do  not take any SARMs, alcohol or Tylenol or other illicit substances -Follow-up with the GI office on Friday for lab work If you have any significant change or worsening of your symptoms do not hesitate to contact your GI doctor or return to the ED   Increase activity slowly   Complete by: As directed      Allergies as of 11/28/2019      Reactions   Penicillins Anaphylaxis, Hives      Medication List    TAKE these medications   hydrOXYzine 50 MG capsule Commonly known as: VISTARIL Take 50 mg by mouth 3 (three) times daily as needed for anxiety.   pantoprazole 20 MG tablet Commonly known as: PROTONIX Take 1 tablet (20 mg total) by mouth daily.       Allergies  Allergen Reactions  . Penicillins Anaphylaxis and Hives     Dispo: The patient is from: Home              Anticipated d/c is to: Home              Anticipated d/c date is: today              Patient currently is medically stable to d/c.       Time coordinating discharge: Over 30 minutes   SIGNED:   Harold Hedge, D.O. [facility] Pager: [phone]  11/28/2019, 2:05 PM

## 2021-08-15 IMAGING — CT CT ABD-PELV W/ CM
2 of 4 series · 17 of 46 positions shown, 19 images · IV contrast (omnipaque)
Comparison: None.

CLINICAL DATA: Abdominal pain

EXAM:
CT ABDOMEN AND PELVIS WITH CONTRAST
TECHNIQUE: Multidetector CT imaging of the abdomen and pelvis was performed
using the standard protocol following bolus administration of
intravenous contrast.
CONTRAST:  100mL OMNIPAQUE IOHEXOL 300 MG/ML  SOLN

[Series 2: axial st · axial · 0.75mm/px · z∈[+1092,+1487]mm · 14 of 91 slices shown, 16 images]
[im 6/91  soft-tissue]
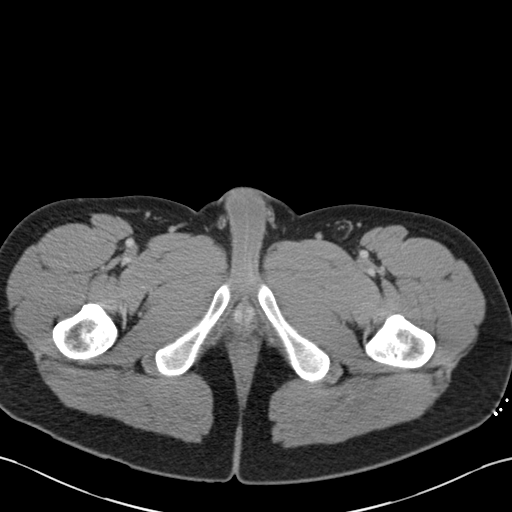
[im 6/91  bone]
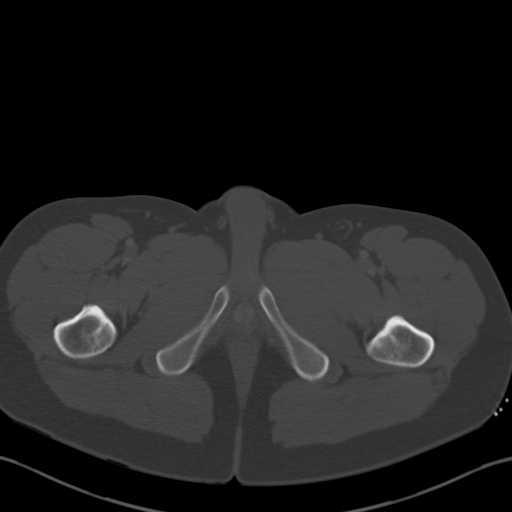
[im 11/91  soft-tissue]
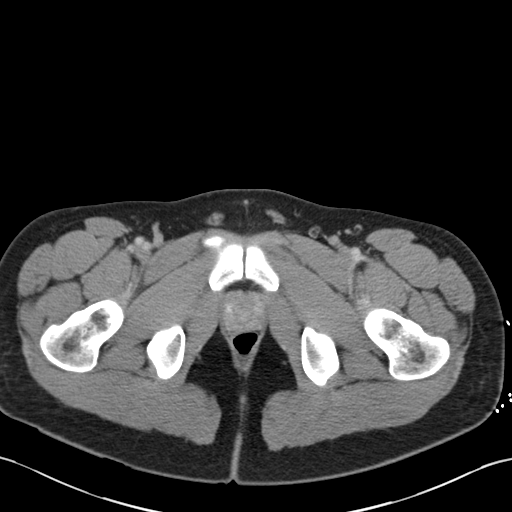
[im 16/91  soft-tissue]
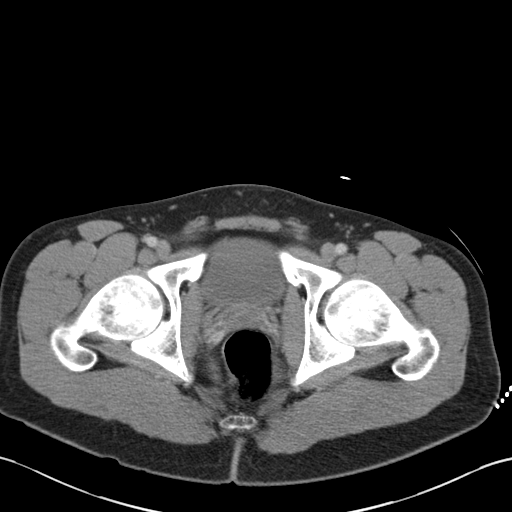
[im 27/91  soft-tissue]
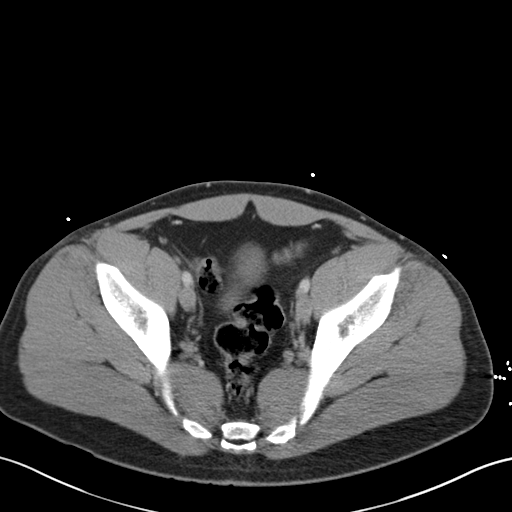
[im 32/91  soft-tissue]
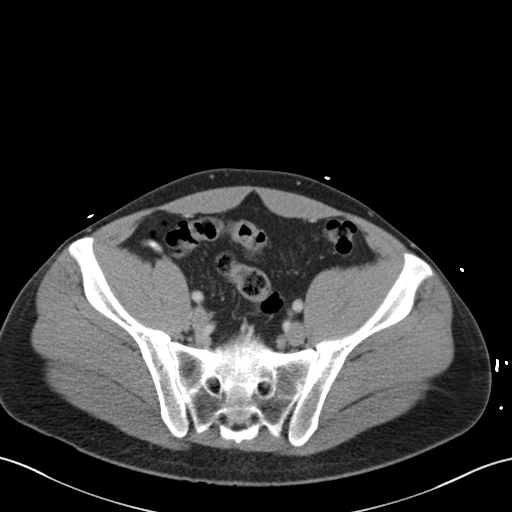
[im 38/91  soft-tissue]
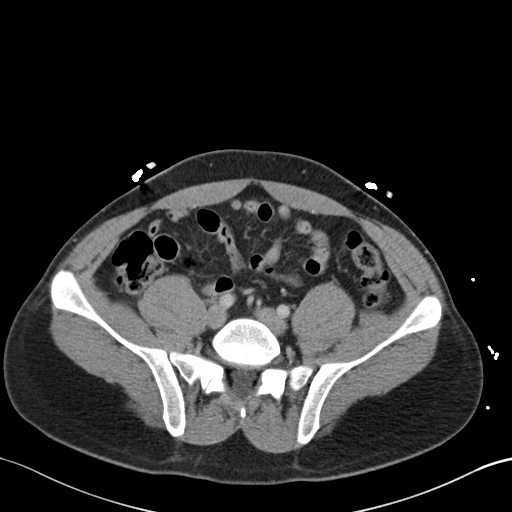
[im 43/91  soft-tissue]
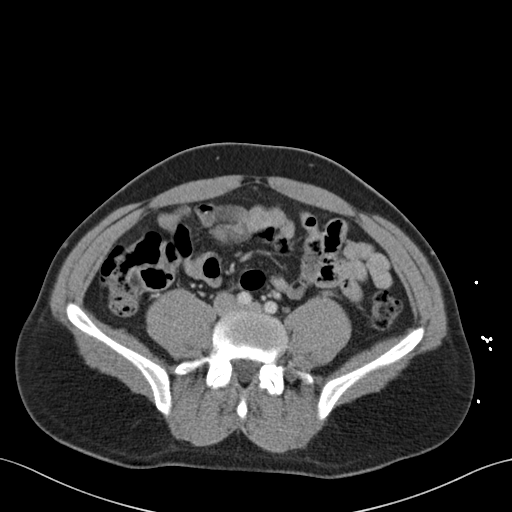
[im 48/91  soft-tissue]
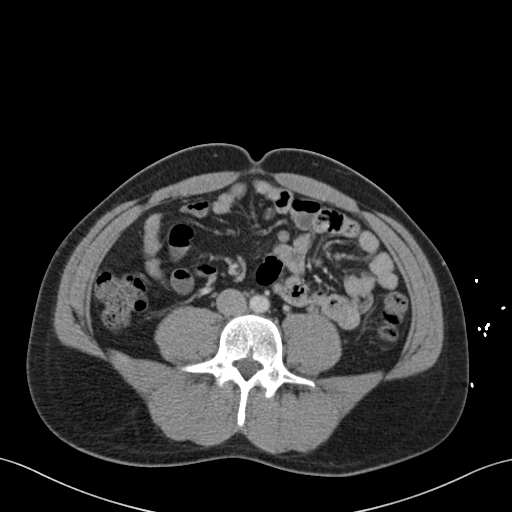
[im 53/91  soft-tissue]
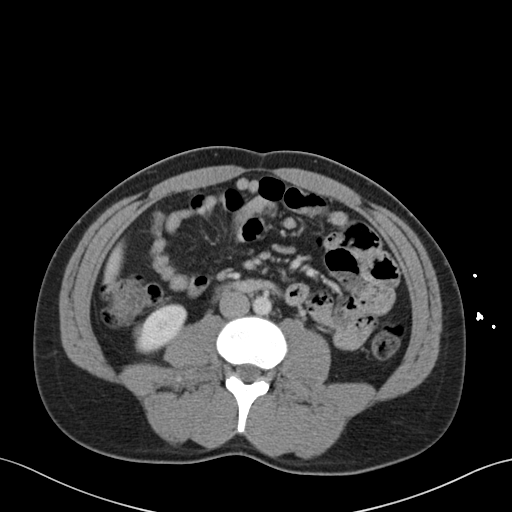
[im 53/91  bone]
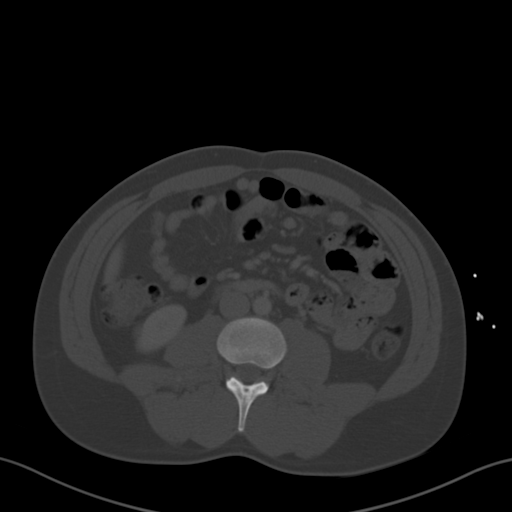
[im 59/91  soft-tissue]
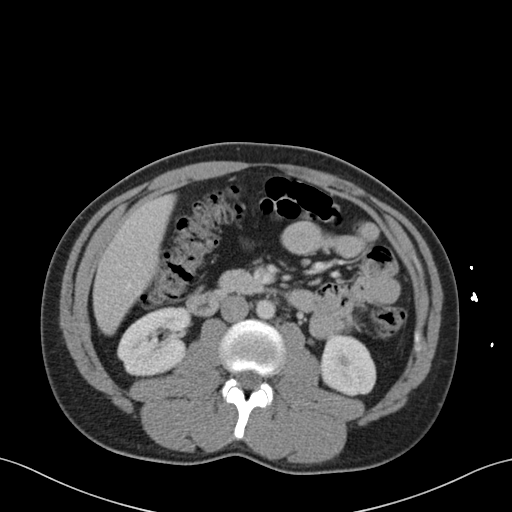
[im 69/91  soft-tissue]
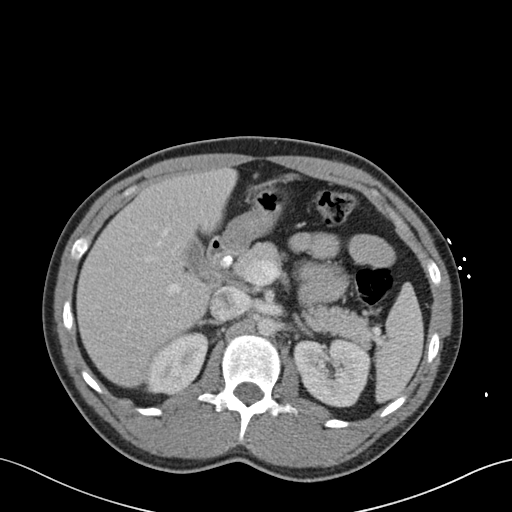
[im 75/91  soft-tissue]
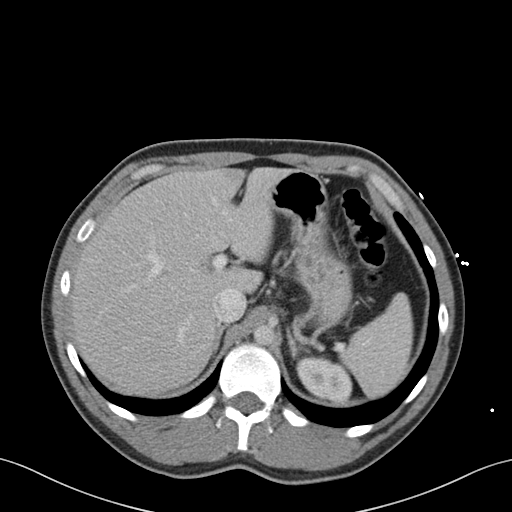
[im 80/91  soft-tissue]
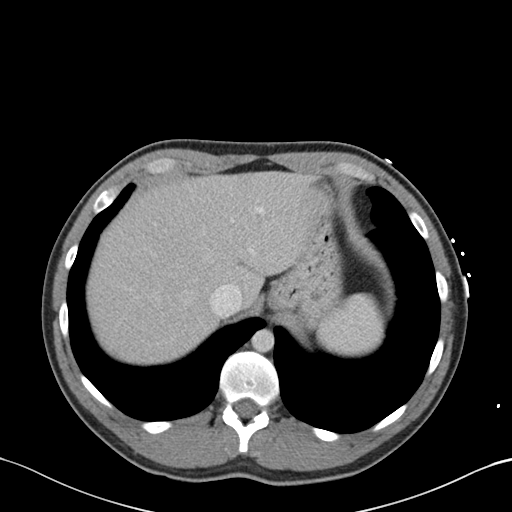
[im 85/91  soft-tissue]
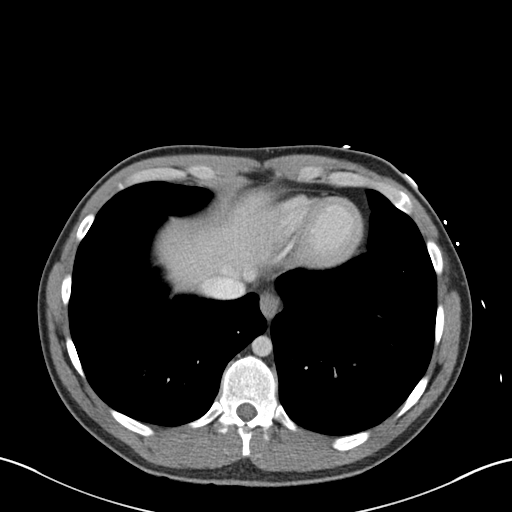

[Series 4: coronal st · coronal · 0.89mm/px · 3 of 116 slices shown]
[im 39/116  soft-tissue]
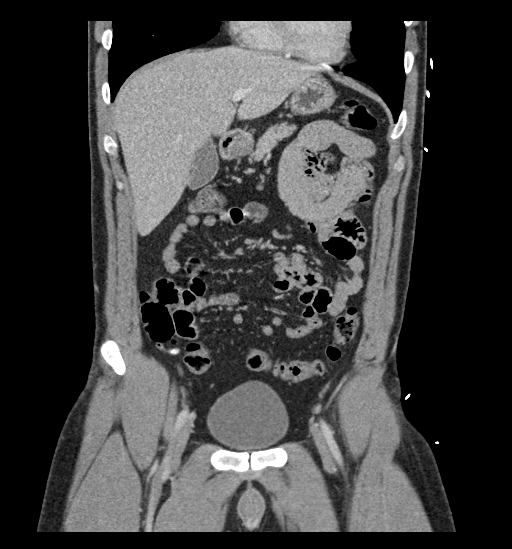
[im 52/116  soft-tissue]
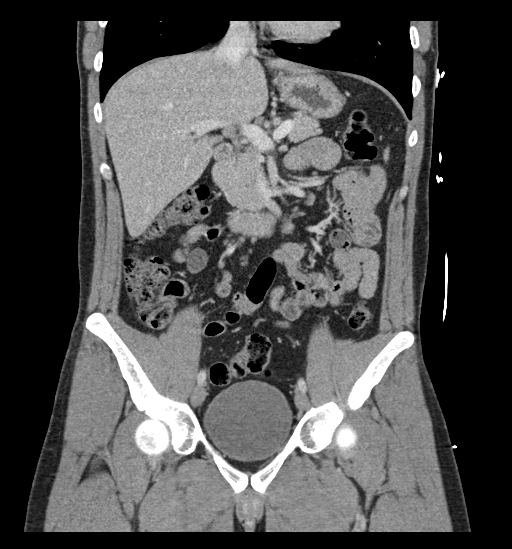
[im 64/116  soft-tissue]
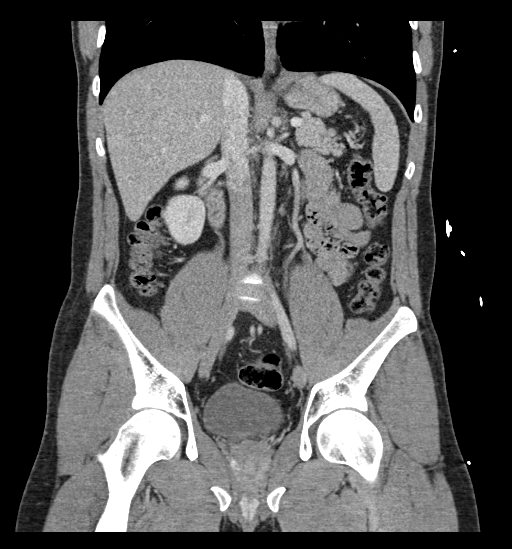

[17 of 46 positions shown; findings below may reference images not displayed]

FINDINGS: Lower chest: No acute abnormality.

Hepatobiliary: No focal liver abnormality is seen. No gallstones,
gallbladder wall thickening, or biliary dilatation.

Pancreas: Unremarkable. No pancreatic ductal dilatation or
surrounding inflammatory changes.

Spleen: Normal in size without focal abnormality.

Adrenals/Urinary Tract: Adrenal glands are within normal limits.
Kidneys demonstrate a normal enhancement pattern bilaterally. No
obstructive changes are seen. Bladder is within normal limits.

Stomach/Bowel: Stomach is within normal limits. Appendix appears
normal. No evidence of bowel wall thickening, distention, or
inflammatory changes.

Vascular/Lymphatic: No significant vascular findings are present. No
enlarged abdominal or pelvic lymph nodes.

Reproductive: Prostate is unremarkable.

Other: No abdominal wall hernia or abnormality. No abdominopelvic
ascites.

Musculoskeletal: No acute or significant osseous findings.
IMPRESSION: No acute abnormality noted.
